# Patient Record
Sex: Male | Born: 1945 | Race: White | Hispanic: No | Marital: Married | Smoking: Never smoker
Health system: Southern US, Community
[De-identification: ages and names within clinical notes are randomized; demographics above are authoritative.]

## PROBLEM LIST (undated history)

## (undated) DIAGNOSIS — G8929 Other chronic pain: Secondary | ICD-10-CM

## (undated) DIAGNOSIS — M5412 Radiculopathy, cervical region: Secondary | ICD-10-CM

## (undated) DIAGNOSIS — K219 Gastro-esophageal reflux disease without esophagitis: Secondary | ICD-10-CM

## (undated) DIAGNOSIS — M5416 Radiculopathy, lumbar region: Secondary | ICD-10-CM

## (undated) DIAGNOSIS — I10 Essential (primary) hypertension: Secondary | ICD-10-CM

## (undated) DIAGNOSIS — M5136 Other intervertebral disc degeneration, lumbar region: Secondary | ICD-10-CM

## (undated) DIAGNOSIS — H47233 Glaucomatous optic atrophy, bilateral: Secondary | ICD-10-CM

## (undated) DIAGNOSIS — M47816 Spondylosis without myelopathy or radiculopathy, lumbar region: Secondary | ICD-10-CM

## (undated) DIAGNOSIS — R3129 Other microscopic hematuria: Secondary | ICD-10-CM

## (undated) DIAGNOSIS — N141 Nephropathy induced by other drugs, medicaments and biological substances: Secondary | ICD-10-CM

## (undated) DIAGNOSIS — I350 Nonrheumatic aortic (valve) stenosis: Secondary | ICD-10-CM

## (undated) DIAGNOSIS — D649 Anemia, unspecified: Secondary | ICD-10-CM

## (undated) DIAGNOSIS — E785 Hyperlipidemia, unspecified: Secondary | ICD-10-CM

## (undated) DIAGNOSIS — R803 Bence Jones proteinuria: Secondary | ICD-10-CM

## (undated) DIAGNOSIS — D472 Monoclonal gammopathy: Secondary | ICD-10-CM

## (undated) DIAGNOSIS — K222 Esophageal obstruction: Secondary | ICD-10-CM

## (undated) DIAGNOSIS — M12812 Other specific arthropathies, not elsewhere classified, left shoulder: Secondary | ICD-10-CM

## (undated) DIAGNOSIS — M48061 Spinal stenosis, lumbar region without neurogenic claudication: Secondary | ICD-10-CM

## (undated) DIAGNOSIS — E8581 Light chain (AL) amyloidosis: Secondary | ICD-10-CM

## (undated) DIAGNOSIS — Z961 Presence of intraocular lens: Secondary | ICD-10-CM

## (undated) DIAGNOSIS — T39395A Adverse effect of other nonsteroidal anti-inflammatory drugs [NSAID], initial encounter: Secondary | ICD-10-CM

## (undated) DIAGNOSIS — Q238 Other congenital malformations of aortic and mitral valves: Secondary | ICD-10-CM

## (undated) DIAGNOSIS — I729 Aneurysm of unspecified site: Secondary | ICD-10-CM

## (undated) DIAGNOSIS — I2510 Atherosclerotic heart disease of native coronary artery without angina pectoris: Secondary | ICD-10-CM

## (undated) DIAGNOSIS — M109 Gout, unspecified: Secondary | ICD-10-CM

## (undated) DIAGNOSIS — L570 Actinic keratosis: Secondary | ICD-10-CM

## (undated) DIAGNOSIS — G5620 Lesion of ulnar nerve, unspecified upper limb: Secondary | ICD-10-CM

## (undated) HISTORY — PX: EYE SURGERY: SHX253

## (undated) HISTORY — PX: CARDIAC SURGERY: SHX584

## (undated) HISTORY — PX: CARPAL TUNNEL RELEASE: SHX101

---

## 2014-03-26 HISTORY — PX: AORTIC VALVE REPLACEMENT: SHX41

## 2019-04-24 ENCOUNTER — Ambulatory Visit: Payer: Self-pay

## 2019-05-11 ENCOUNTER — Ambulatory Visit: Payer: Self-pay

## 2020-03-04 ENCOUNTER — Emergency Department (HOSPITAL_COMMUNITY): Payer: Medicare PPO

## 2020-03-04 ENCOUNTER — Other Ambulatory Visit: Payer: Self-pay

## 2020-03-04 ENCOUNTER — Observation Stay (HOSPITAL_COMMUNITY)
Admission: EM | Admit: 2020-03-04 | Discharge: 2020-03-06 | Disposition: A | Discharge status: Home / Self Care | Payer: Medicare PPO | Attending: Internal Medicine | Admitting: Internal Medicine

## 2020-03-04 DIAGNOSIS — Z79899 Other long term (current) drug therapy: Secondary | ICD-10-CM | POA: Diagnosis not present

## 2020-03-04 DIAGNOSIS — R55 Syncope and collapse: Principal | ICD-10-CM | POA: Diagnosis present

## 2020-03-04 DIAGNOSIS — Z20822 Contact with and (suspected) exposure to covid-19: Secondary | ICD-10-CM | POA: Insufficient documentation

## 2020-03-04 DIAGNOSIS — R531 Weakness: Secondary | ICD-10-CM

## 2020-03-04 DIAGNOSIS — Z7982 Long term (current) use of aspirin: Secondary | ICD-10-CM | POA: Insufficient documentation

## 2020-03-04 DIAGNOSIS — N1832 Chronic kidney disease, stage 3b: Secondary | ICD-10-CM | POA: Diagnosis not present

## 2020-03-04 DIAGNOSIS — D539 Nutritional anemia, unspecified: Secondary | ICD-10-CM | POA: Insufficient documentation

## 2020-03-04 DIAGNOSIS — Z9221 Personal history of antineoplastic chemotherapy: Secondary | ICD-10-CM | POA: Insufficient documentation

## 2020-03-04 DIAGNOSIS — R4020 Unspecified coma: Secondary | ICD-10-CM | POA: Diagnosis present

## 2020-03-04 DIAGNOSIS — R0602 Shortness of breath: Secondary | ICD-10-CM | POA: Insufficient documentation

## 2020-03-04 DIAGNOSIS — I129 Hypertensive chronic kidney disease with stage 1 through stage 4 chronic kidney disease, or unspecified chronic kidney disease: Secondary | ICD-10-CM | POA: Diagnosis not present

## 2020-03-04 DIAGNOSIS — G459 Transient cerebral ischemic attack, unspecified: Secondary | ICD-10-CM | POA: Diagnosis not present

## 2020-03-04 DIAGNOSIS — E8581 Light chain (AL) amyloidosis: Secondary | ICD-10-CM | POA: Diagnosis not present

## 2020-03-04 HISTORY — DX: Aneurysm of unspecified site: I72.9

## 2020-03-04 HISTORY — DX: Hyperlipidemia, unspecified: E78.5

## 2020-03-04 HISTORY — DX: Actinic keratosis: L57.0

## 2020-03-04 HISTORY — DX: Gastro-esophageal reflux disease without esophagitis: K21.9

## 2020-03-04 HISTORY — DX: Glaucomatous optic atrophy, bilateral: H47.233

## 2020-03-04 HISTORY — DX: Other congenital malformations of aortic and mitral valves: Q23.8

## 2020-03-04 HISTORY — DX: Adverse effect of other nonsteroidal anti-inflammatory drugs (NSAID), initial encounter: T39.395A

## 2020-03-04 HISTORY — DX: Lesion of ulnar nerve, unspecified upper limb: G56.20

## 2020-03-04 HISTORY — DX: Atherosclerotic heart disease of native coronary artery without angina pectoris: I25.10

## 2020-03-04 HISTORY — DX: Nephropathy induced by other drugs, medicaments and biological substances: N14.1

## 2020-03-04 HISTORY — DX: Presence of intraocular lens: Z96.1

## 2020-03-04 HISTORY — DX: Monoclonal gammopathy: D47.2

## 2020-03-04 HISTORY — DX: Gout, unspecified: M10.9

## 2020-03-04 HISTORY — DX: Spondylosis without myelopathy or radiculopathy, lumbar region: M47.816

## 2020-03-04 HISTORY — DX: Nonrheumatic aortic (valve) stenosis: I35.0

## 2020-03-04 HISTORY — DX: Radiculopathy, lumbar region: M54.16

## 2020-03-04 HISTORY — DX: Essential (primary) hypertension: I10

## 2020-03-04 HISTORY — DX: Other intervertebral disc degeneration, lumbar region: M51.36

## 2020-03-04 HISTORY — DX: Radiculopathy, cervical region: M54.12

## 2020-03-04 HISTORY — DX: Light chain (AL) amyloidosis: E85.81

## 2020-03-04 HISTORY — DX: Other chronic pain: G89.29

## 2020-03-04 HISTORY — DX: Other microscopic hematuria: R31.29

## 2020-03-04 HISTORY — DX: Other specific arthropathies, not elsewhere classified, left shoulder: M12.812

## 2020-03-04 HISTORY — DX: Anemia, unspecified: D64.9

## 2020-03-04 HISTORY — DX: Esophageal obstruction: K22.2

## 2020-03-04 HISTORY — DX: Spinal stenosis, lumbar region without neurogenic claudication: M48.061

## 2020-03-04 HISTORY — DX: Bence Jones proteinuria: R80.3

## 2020-03-04 LAB — DIFFERENTIAL
Abs Immature Granulocytes: 0.05 10*3/uL (ref 0.00–0.07)
Basophils Absolute: 0.1 10*3/uL (ref 0.0–0.1)
Basophils Relative: 1 %
Eosinophils Absolute: 0.2 10*3/uL (ref 0.0–0.5)
Eosinophils Relative: 1 %
Immature Granulocytes: 1 %
Lymphocytes Relative: 31 %
Lymphs Abs: 3.4 10*3/uL (ref 0.7–4.0)
Monocytes Absolute: 1 10*3/uL (ref 0.1–1.0)
Monocytes Relative: 9 %
Neutro Abs: 6.2 10*3/uL (ref 1.7–7.7)
Neutrophils Relative %: 57 %

## 2020-03-04 LAB — COMPREHENSIVE METABOLIC PANEL
ALT: 95 U/L — ABNORMAL HIGH (ref 0–44)
AST: 137 U/L — ABNORMAL HIGH (ref 15–41)
Albumin: 2.6 g/dL — ABNORMAL LOW (ref 3.5–5.0)
Alkaline Phosphatase: 312 U/L — ABNORMAL HIGH (ref 38–126)
Anion gap: 13 (ref 5–15)
BUN: 29 mg/dL — ABNORMAL HIGH (ref 8–23)
CO2: 21 mmol/L — ABNORMAL LOW (ref 22–32)
Calcium: 8.3 mg/dL — ABNORMAL LOW (ref 8.9–10.3)
Chloride: 104 mmol/L (ref 98–111)
Creatinine, Ser: 2.01 mg/dL — ABNORMAL HIGH (ref 0.61–1.24)
GFR, Estimated: 34 mL/min — ABNORMAL LOW (ref >60)
Glucose, Bld: 142 mg/dL — ABNORMAL HIGH (ref 70–99)
Potassium: 4 mmol/L (ref 3.5–5.1)
Sodium: 138 mmol/L (ref 135–145)
Total Bilirubin: 1 mg/dL (ref 0.3–1.2)
Total Protein: 5.2 g/dL — ABNORMAL LOW (ref 6.5–8.1)

## 2020-03-04 LAB — URINALYSIS, ROUTINE W REFLEX MICROSCOPIC
Bacteria, UA: NONE SEEN
Bilirubin Urine: NEGATIVE
Glucose, UA: NEGATIVE mg/dL
Hgb urine dipstick: NEGATIVE
Ketones, ur: NEGATIVE mg/dL
Leukocytes,Ua: NEGATIVE
Nitrite: NEGATIVE
Protein, ur: 100 mg/dL — AB
Specific Gravity, Urine: 1.011 (ref 1.005–1.030)
pH: 6 (ref 5.0–8.0)

## 2020-03-04 LAB — I-STAT CHEM 8, ED
BUN: 30 mg/dL — ABNORMAL HIGH (ref 8–23)
Calcium, Ion: 1.07 mmol/L — ABNORMAL LOW (ref 1.15–1.40)
Chloride: 105 mmol/L (ref 98–111)
Creatinine, Ser: 1.9 mg/dL — ABNORMAL HIGH (ref 0.61–1.24)
Glucose, Bld: 136 mg/dL — ABNORMAL HIGH (ref 70–99)
HCT: 25 % — ABNORMAL LOW (ref 39.0–52.0)
Hemoglobin: 8.5 g/dL — ABNORMAL LOW (ref 13.0–17.0)
Potassium: 3.9 mmol/L (ref 3.5–5.1)
Sodium: 137 mmol/L (ref 135–145)
TCO2: 20 mmol/L — ABNORMAL LOW (ref 22–32)

## 2020-03-04 LAB — CBC
HCT: 27.3 % — ABNORMAL LOW (ref 39.0–52.0)
Hemoglobin: 9 g/dL — ABNORMAL LOW (ref 13.0–17.0)
MCH: 33.3 pg (ref 26.0–34.0)
MCHC: 33 g/dL (ref 30.0–36.0)
MCV: 101.1 fL — ABNORMAL HIGH (ref 80.0–100.0)
Platelets: 176 10*3/uL (ref 150–400)
RBC: 2.7 MIL/uL — ABNORMAL LOW (ref 4.22–5.81)
RDW: 18.5 % — ABNORMAL HIGH (ref 11.5–15.5)
WBC: 10.8 10*3/uL — ABNORMAL HIGH (ref 4.0–10.5)
nRBC: 0.6 % — ABNORMAL HIGH (ref 0.0–0.2)

## 2020-03-04 LAB — APTT: aPTT: 28 seconds (ref 24–36)

## 2020-03-04 LAB — CBG MONITORING, ED: Glucose-Capillary: 128 mg/dL — ABNORMAL HIGH (ref 70–99)

## 2020-03-04 LAB — RESP PANEL BY RT-PCR (FLU A&B, COVID) ARPGX2
Influenza A by PCR: NEGATIVE
Influenza B by PCR: NEGATIVE
SARS Coronavirus 2 by RT PCR: NEGATIVE

## 2020-03-04 LAB — TROPONIN I (HIGH SENSITIVITY): Troponin I (High Sensitivity): 116 ng/L (ref <18)

## 2020-03-04 LAB — PROTIME-INR
INR: 1 (ref 0.8–1.2)
Prothrombin Time: 12.8 seconds (ref 11.4–15.2)

## 2020-03-04 LAB — BRAIN NATRIURETIC PEPTIDE: B Natriuretic Peptide: 440.7 pg/mL — ABNORMAL HIGH (ref 0.0–100.0)

## 2020-03-04 IMAGING — MR MR MRA HEAD W/O CM
1 series · 26 of 48 positions shown · non-contrast
Comparison: Brain MRI, neck MRA today.

CLINICAL DATA: 74-year-old male code stroke, left-sided weakness.
TIA.

EXAM:
MRA HEAD WITHOUT CONTRAST
TECHNIQUE: Angiographic images of the Circle of Willis were obtained using MRA
technique without intravenous contrast.

[Series 5: 3d cow · axial · 0.5mm · 0.41mm/px · z∈[-41,+38]mm · 26 of 172 slices shown]
[im 1/172]
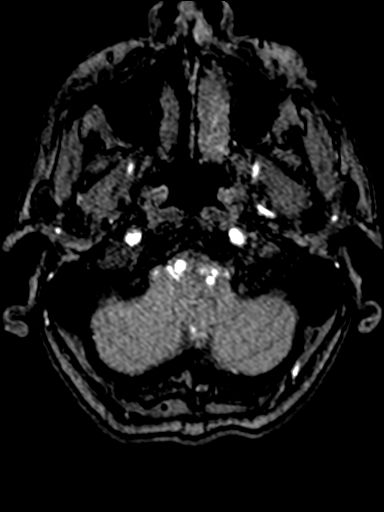
[im 4/172]
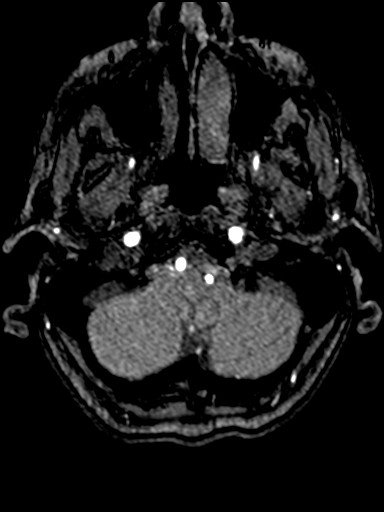
[im 8/172]
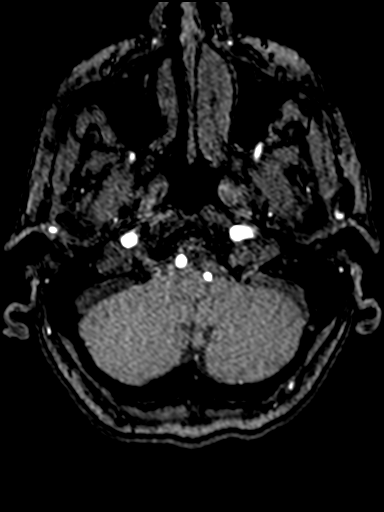
[im 11/172]
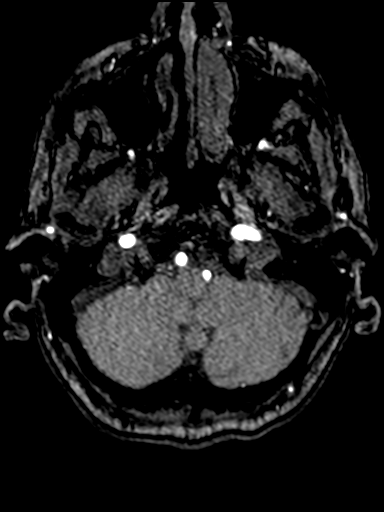
[im 15/172]
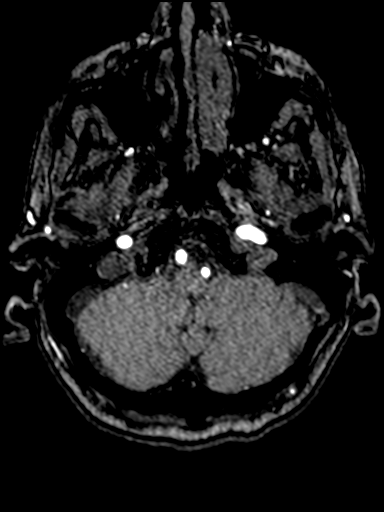
[im 19/172]
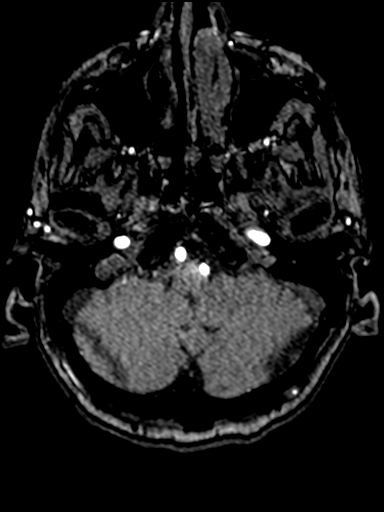
[im 22/172]
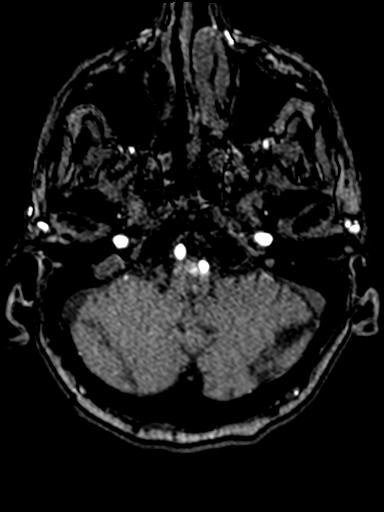
[im 26/172]
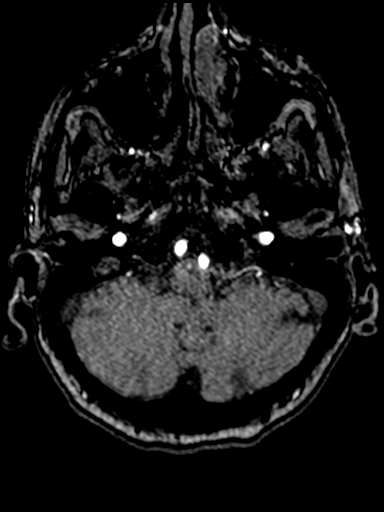
[im 30/172]
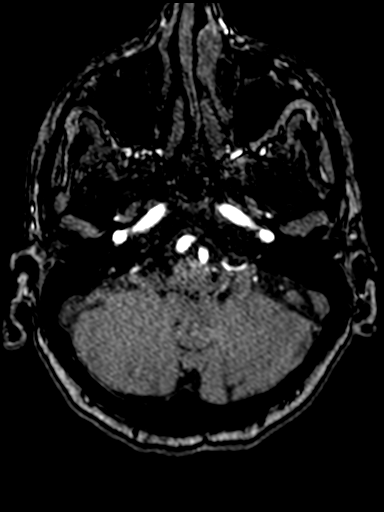
[im 33/172]
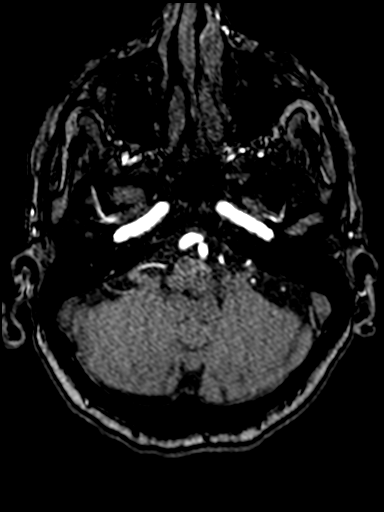
[im 37/172]
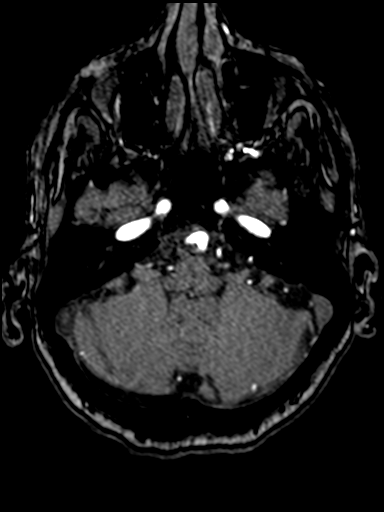
[im 41/172]
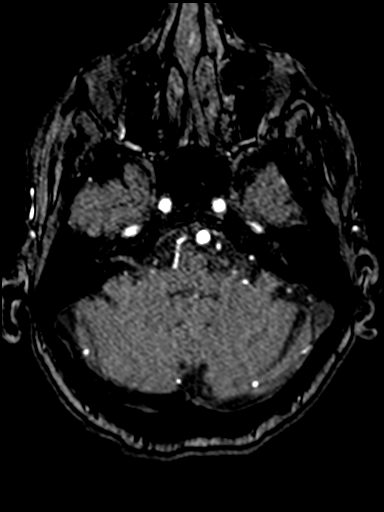
[im 44/172]
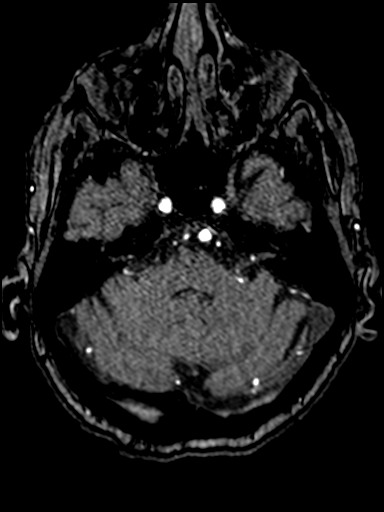
[im 48/172]
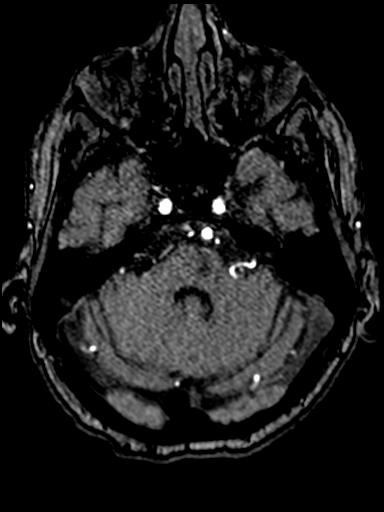
[im 51/172]
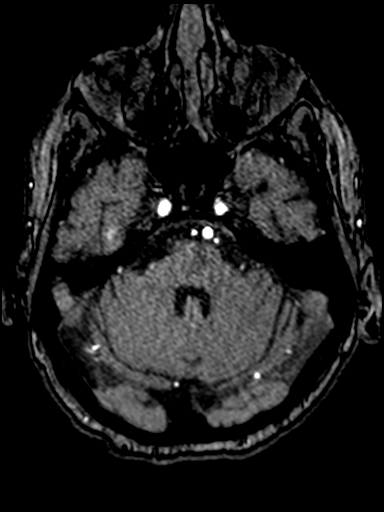
[im 55/172]
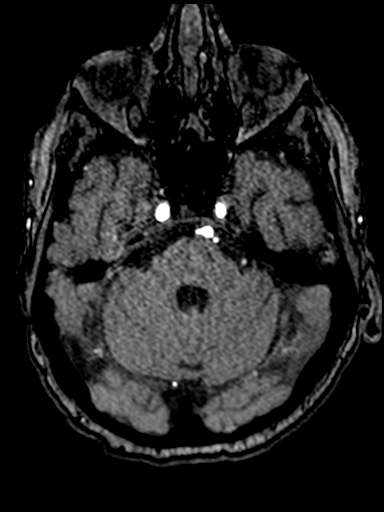
[im 59/172]
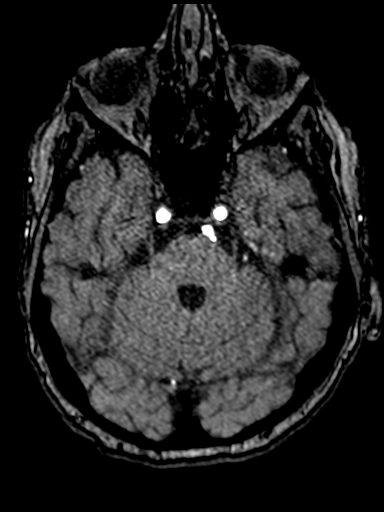
[im 62/172]
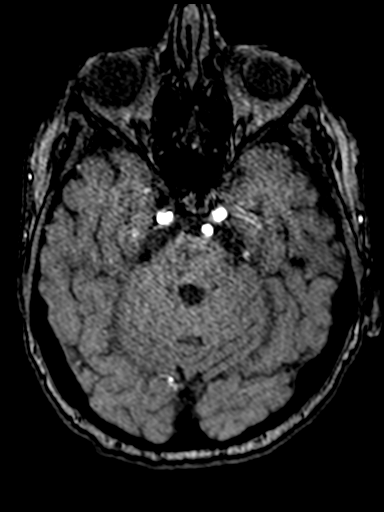
[im 66/172]
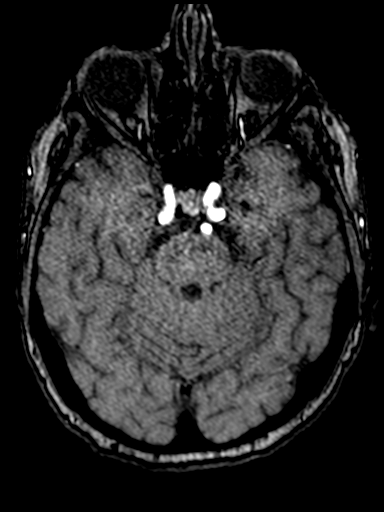
[im 77/172]
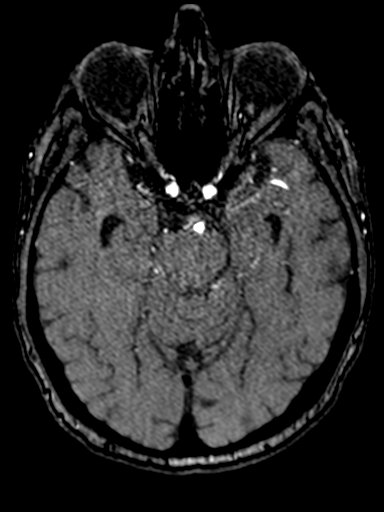
[im 88/172]
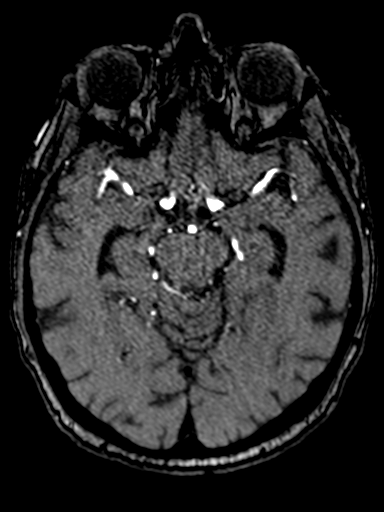
[im 99/172]
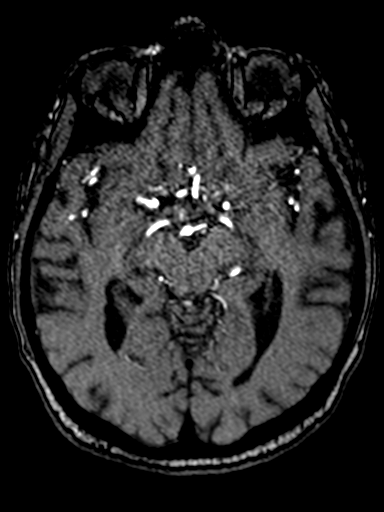
[im 121/172]
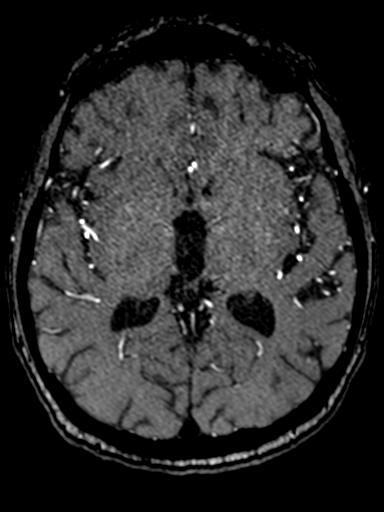
[im 142/172]
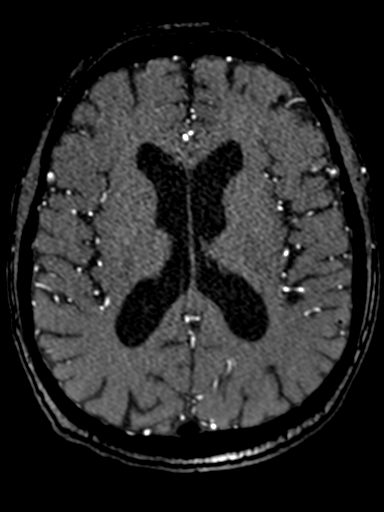
[im 146/172]
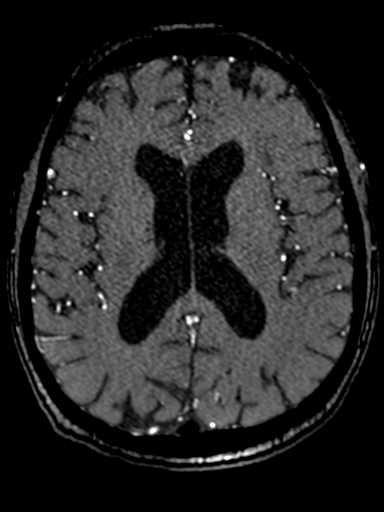
[im 164/172]
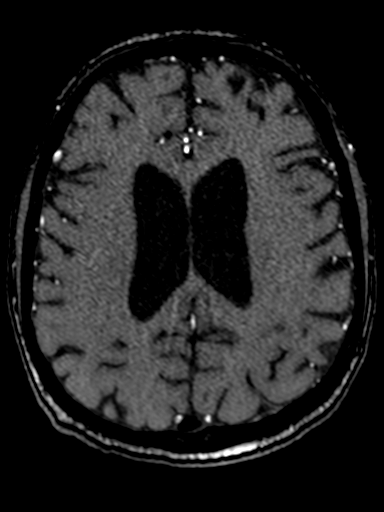

[26 of 48 positions shown; findings below may reference images not displayed]

FINDINGS: Antegrade flow in the posterior circulation with mildly dominant
distal right vertebral artery. No distal vertebral or
vertebrobasilar junction stenosis. Patent basilar artery with no
stenosis. Normal AICA, SCA and PCA origins. Posterior communicating
arteries are diminutive or absent. Bilateral PCA branches are within
normal limits.

Antegrade flow in both ICA siphons. Mild siphon tortuosity. No
siphon stenosis. Normal ophthalmic artery origins. Patent carotid
termini. MCA and ACA origins appear normal. Diminutive or absent
anterior communicating artery. Visible bilateral ACA branches are
within normal limits. MCA M1 segments, left MCA bifurcation, and
right MCA trifurcation are patent without stenosis. Visible
bilateral MCA branches are within normal limits.
IMPRESSION: Negative intracranial MRA.

## 2020-03-04 IMAGING — MR MR MRA NECK W/O CM
2 series · 41 of 48 positions shown · non-contrast
Comparison: Brain MRI today reported separately.

CLINICAL DATA: 74-year-old male code stroke, left-sided weakness.
TIA.

EXAM:
MRA NECK WITHOUT CONTRAST
TECHNIQUE: Angiographic images of the neck were obtained using MRA technique
without intravenous contrast. Carotid stenosis measurements (when
applicable) are obtained utilizing NASCET criteria, using the distal
internal carotid diameter as the denominator.

[Series 10: tof_fl3d_tra_iso · axial · 0.6mm · 0.52mm/px · z∈[-237,-35]mm · 40 of 376 slices shown]
[im 1/376]
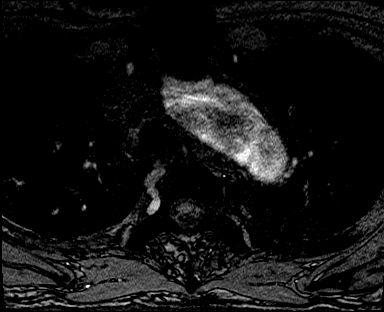
[im 9/376]
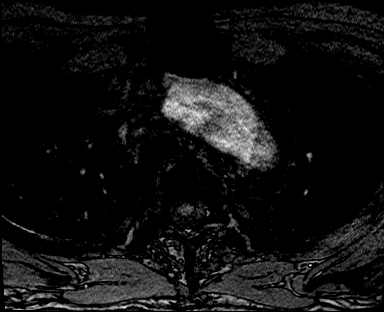
[im 17/376]
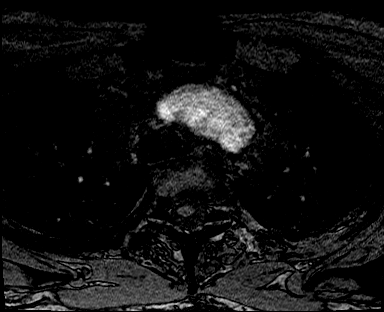
[im 25/376]
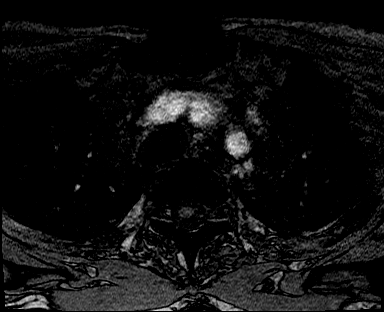
[im 33/376]
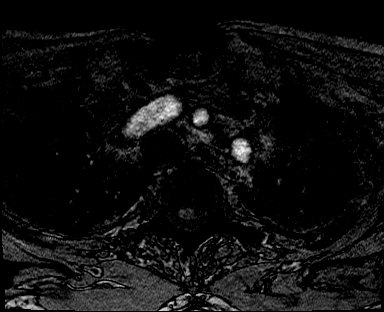
[im 41/376]
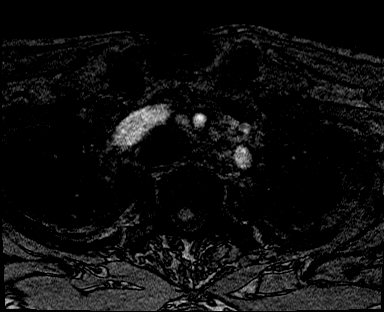
[im 49/376]
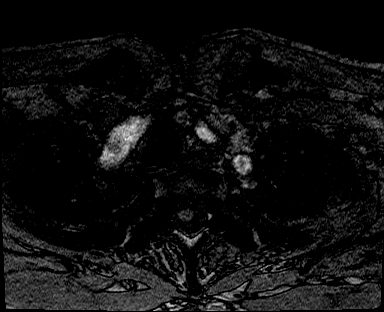
[im 58/376]
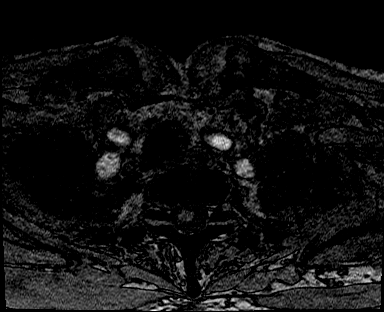
[im 66/376]
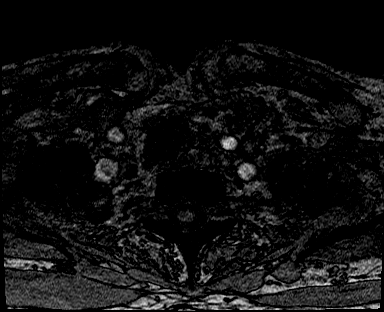
[im 74/376]
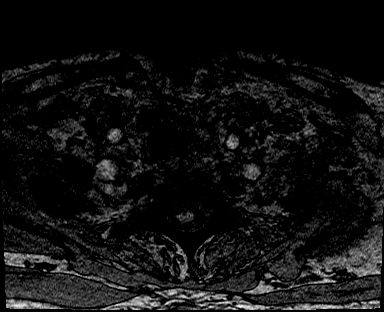
[im 82/376]
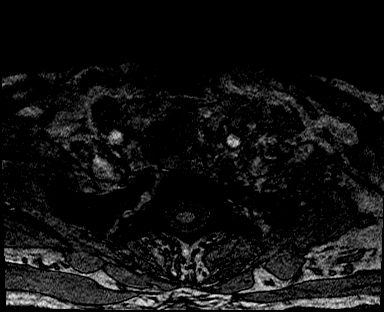
[im 90/376]
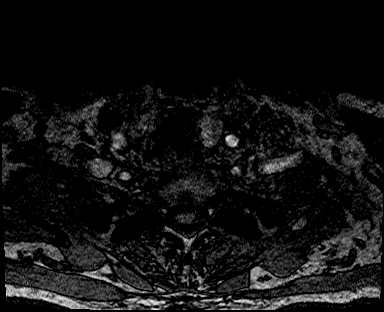
[im 98/376]
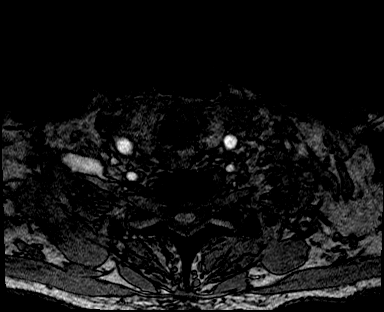
[im 106/376]
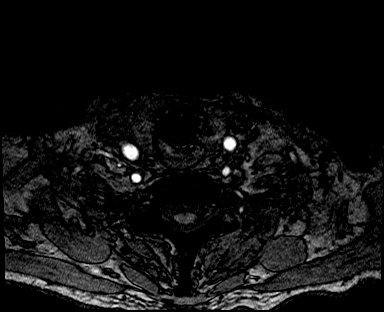
[im 115/376]
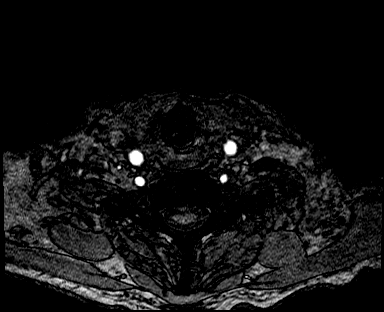
[im 123/376]
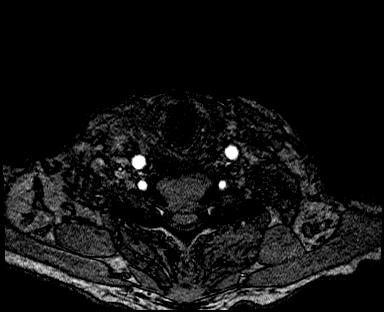
[im 131/376]
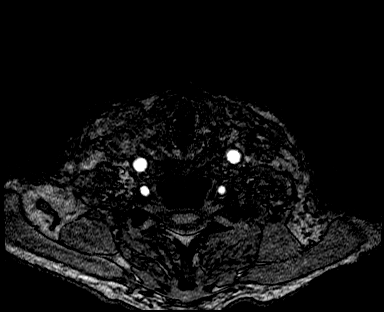
[im 139/376]
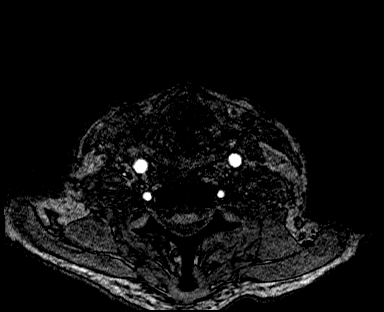
[im 147/376]
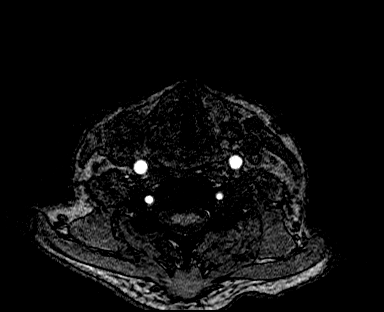
[im 155/376]
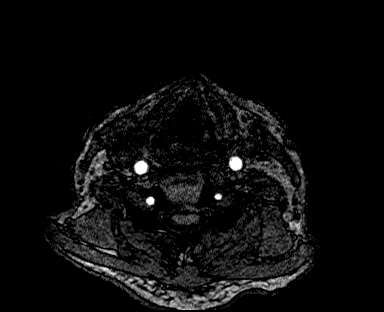
[im 164/376]
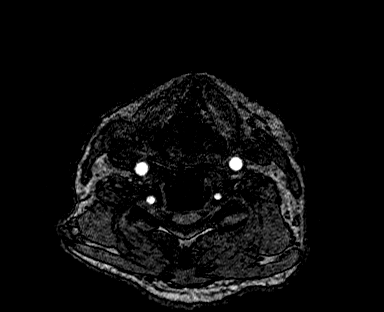
[im 172/376]
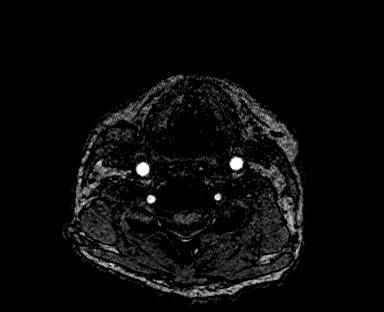
[im 180/376]
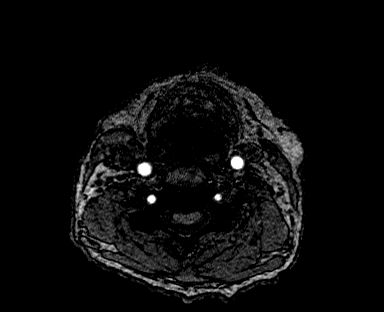
[im 188/376]
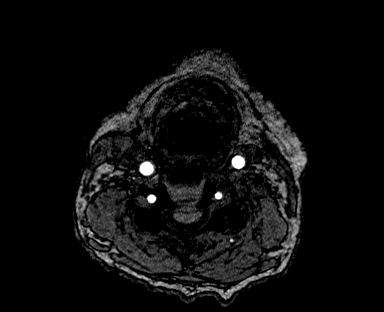
[im 196/376]
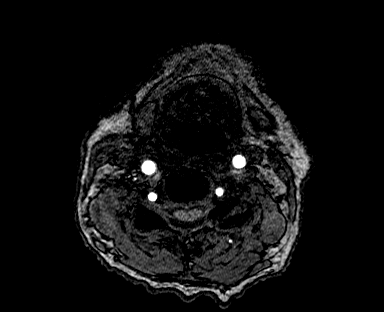
[im 204/376]
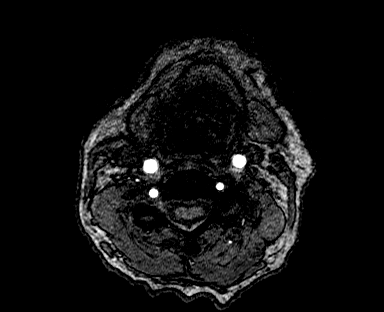
[im 212/376]
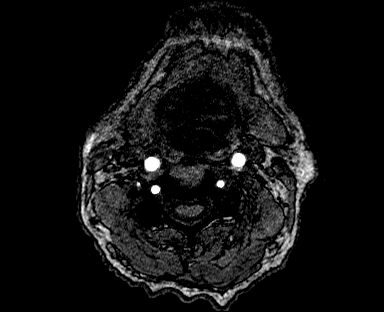
[im 221/376]
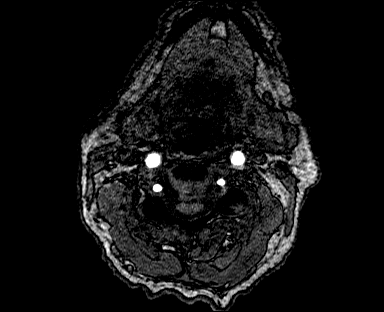
[im 229/376]
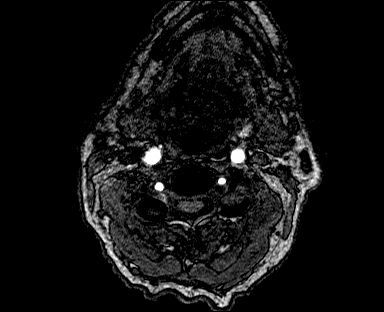
[im 237/376]
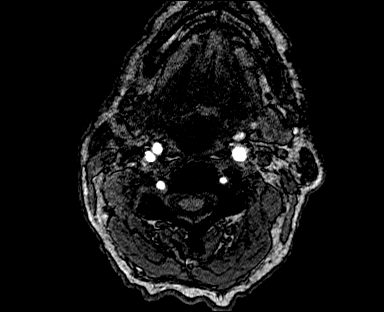
[im 245/376]
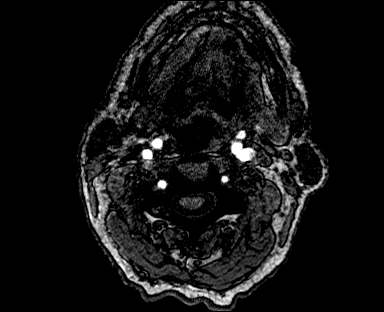
[im 253/376]
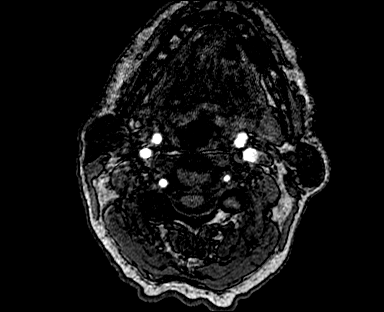
[im 261/376]
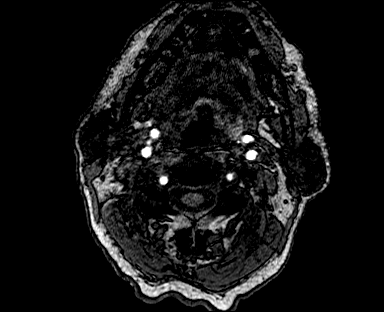
[im 270/376]
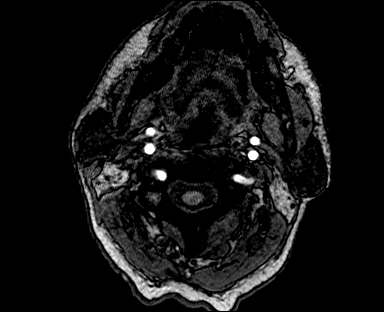
[im 278/376]
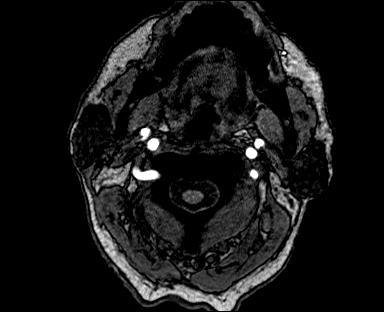
[im 286/376]
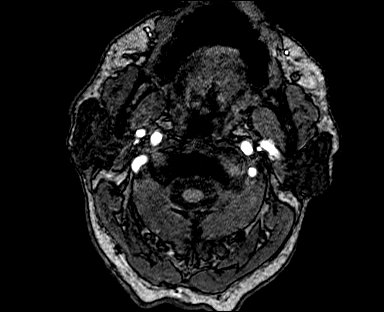
[im 294/376]
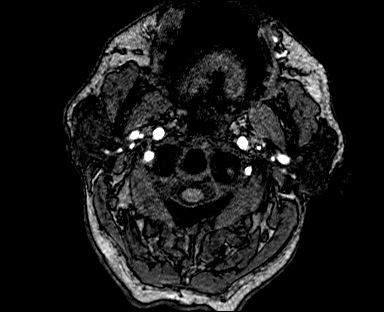
[im 310/376]
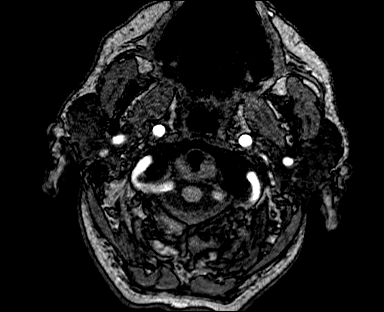
[im 318/376]
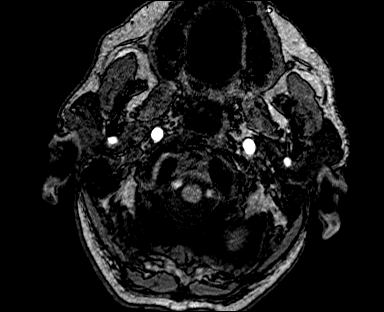
[im 359/376]
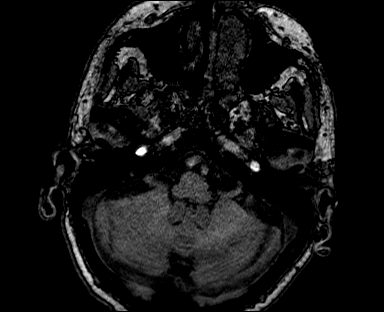

[Series 14: t1_se_fs_tra_blood-suppr. · axial · 2.0mm · 0.47mm/px · 1 of 11 slices shown]
[im 1/11]
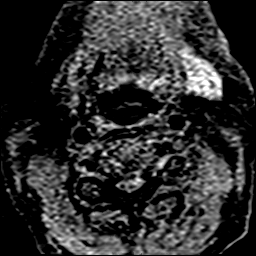

[41 of 48 positions shown; findings below may reference images not displayed]

FINDINGS: Time-of-flight MRA images reveal antegrade flow in the bilateral
cervical carotid and vertebral arteries.

The level of the aortic arch is included and a 3 vessel arch
configuration is noted.

Right CCA, right carotid bifurcation, and cervical right ICA are
within normal limits.

Left CCA, left carotid bifurcation and cervical left ICA are normal
aside from tortuosity.

Grossly normal proximal subclavian arteries and vertebral artery
origins. No evidence of cervical vertebral stenosis, the right
vertebral appears slightly dominant.
IMPRESSION: Normal for age noncontrast neck MRA.

## 2020-03-04 IMAGING — CT CT HEAD CODE STROKE
3 series · 15 of 47 positions shown, 18 images · non-contrast
Comparison: None.

CLINICAL DATA: Code stroke.  Left-sided weakness.

EXAM:
CT HEAD WITHOUT CONTRAST
TECHNIQUE: Contiguous axial images were obtained from the base of the skull
through the vertex without intravenous contrast.

[Series 3: head 5.0 st · axial · 0.41mm/px · z∈[-109,+31]mm · 9 of 34 slices shown, 12 images]
[im 3/34  brain]
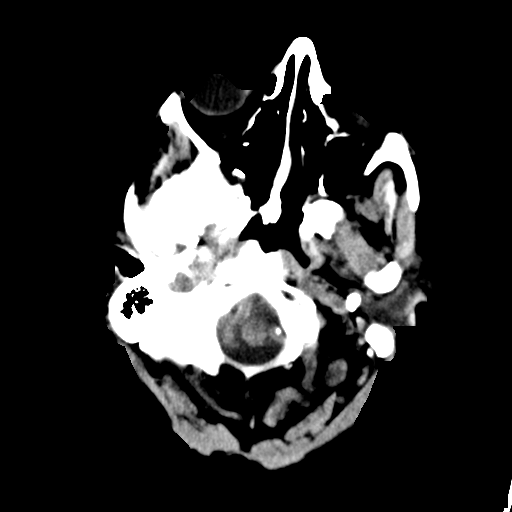
[im 3/34  bone]
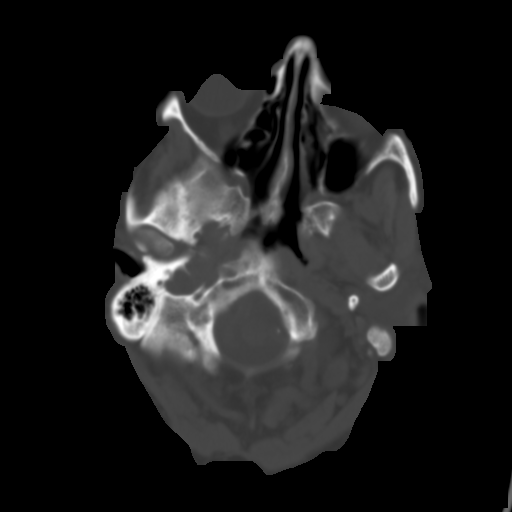
[im 6/34  brain]
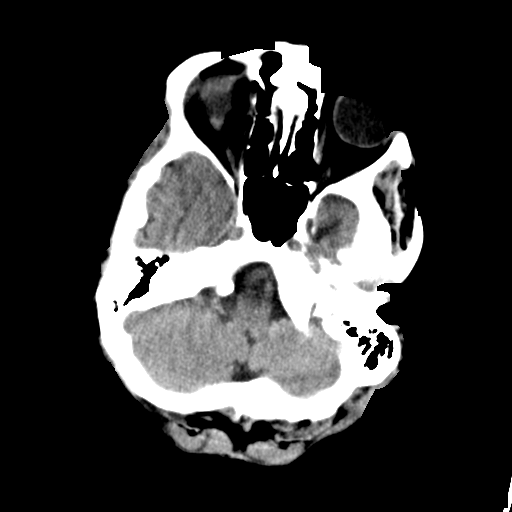
[im 10/34  brain]
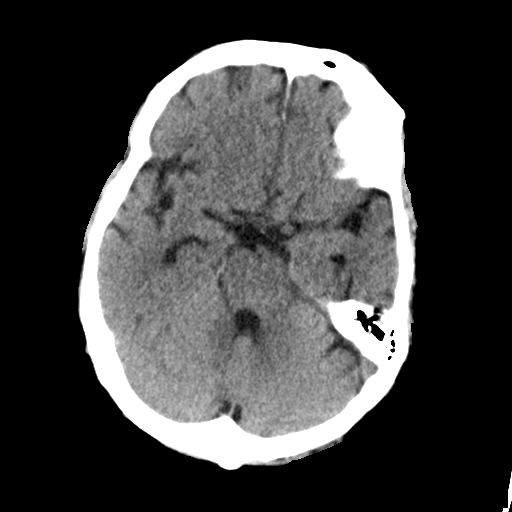
[im 13/34  brain]
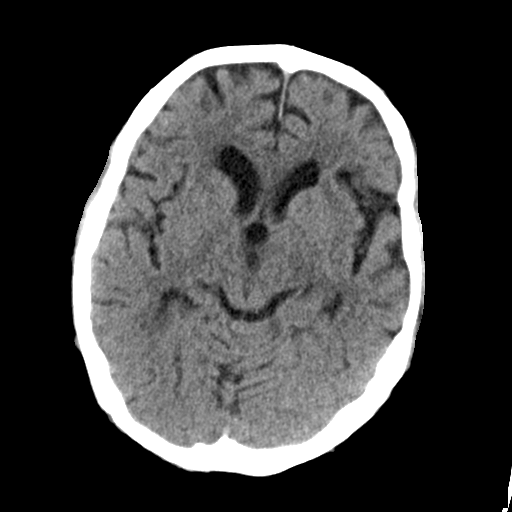
[im 18/34  brain]
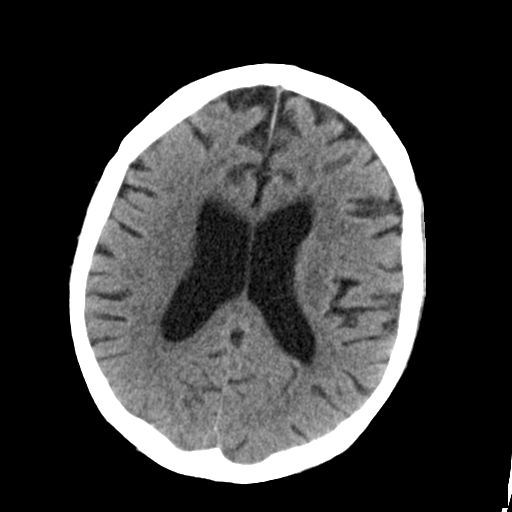
[im 18/34  bone]
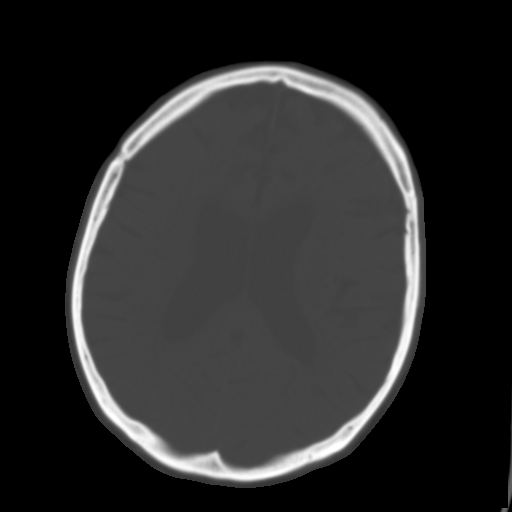
[im 21/34  brain]
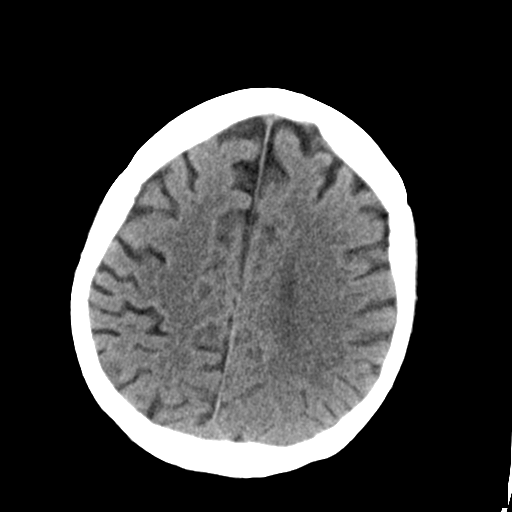
[im 24/34  brain]
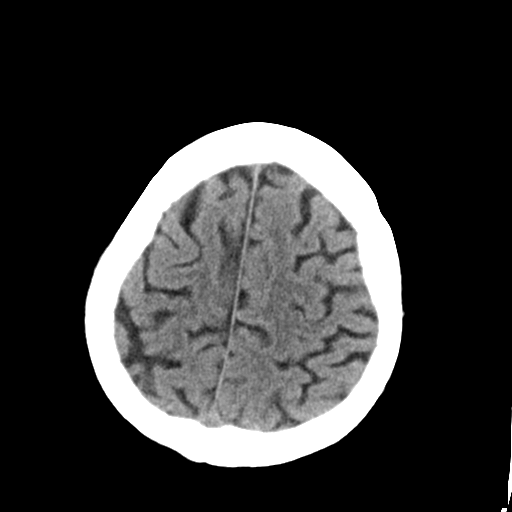
[im 28/34  brain]
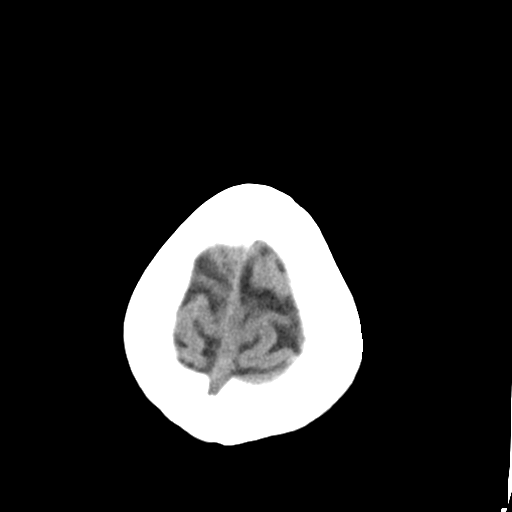
[im 31/34  brain]
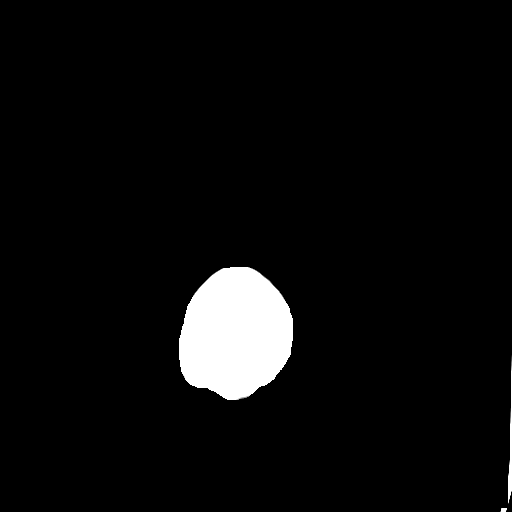
[im 31/34  bone]
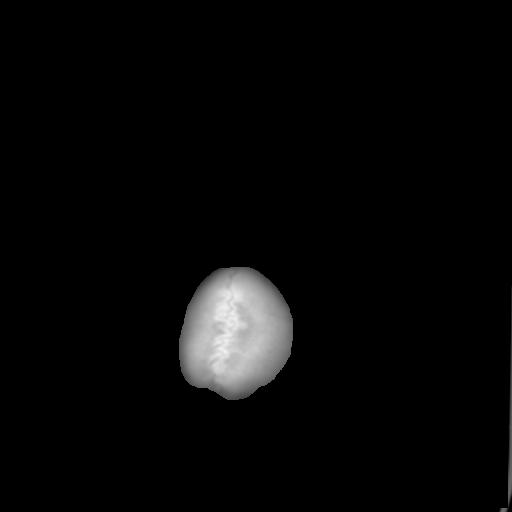

[Series 5: head 3.0 cor st · coronal · 0.30mm/px · 3 of 67 slices shown]
[im 23/67  brain]
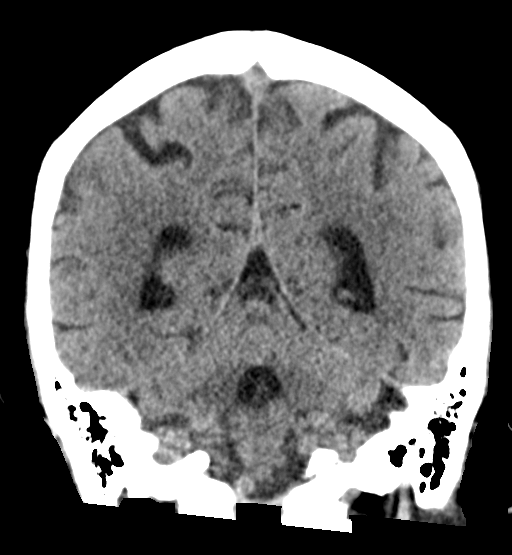
[im 30/67  brain]
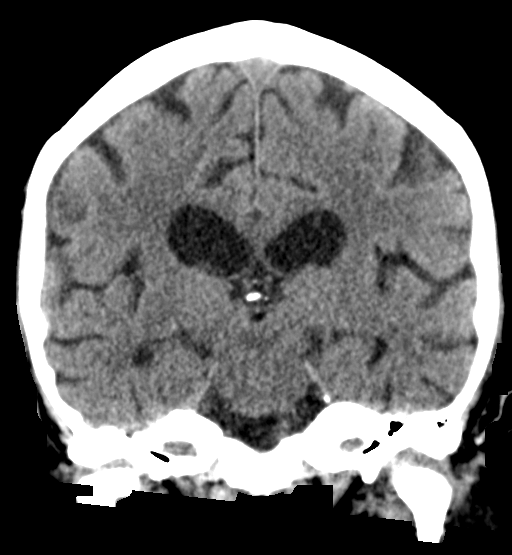
[im 37/67  brain]
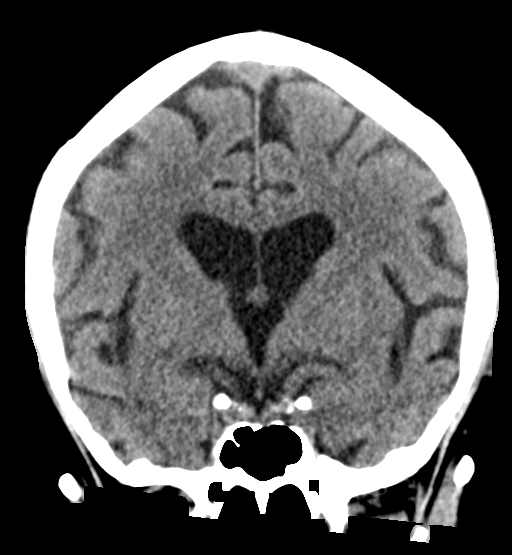

[Series 6: head 3.0 sag st · sagittal · 0.33mm/px · 3 of 52 slices shown]
[im 18/52  brain]
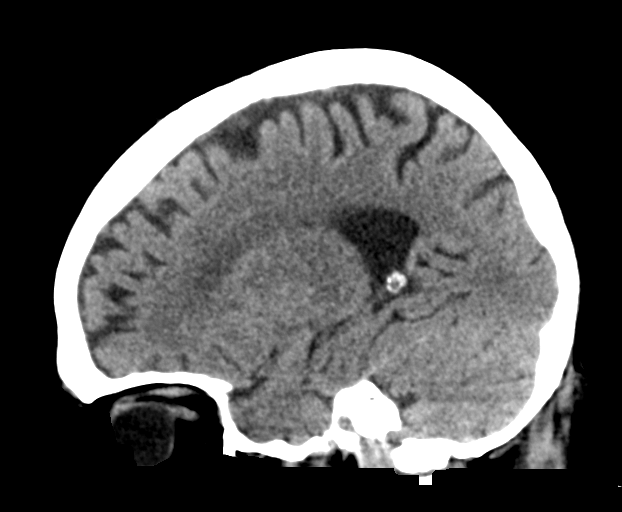
[im 26/52  brain]
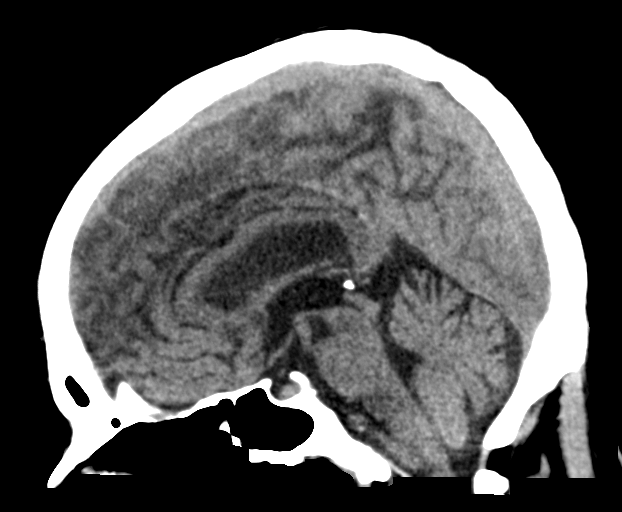
[im 35/52  brain]
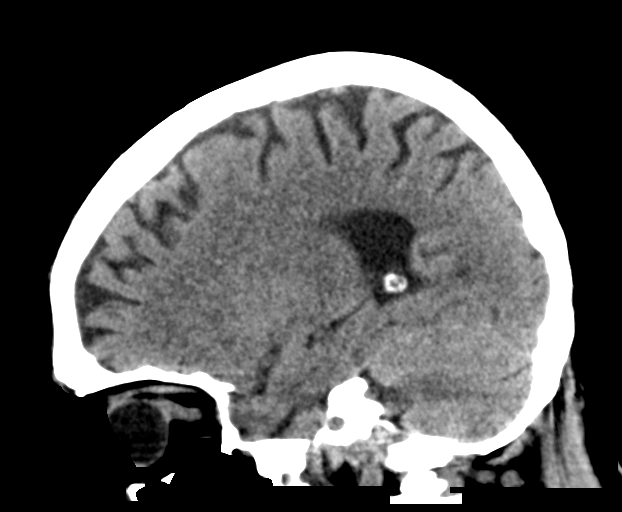

[15 of 47 positions shown; findings below may reference images not displayed]

FINDINGS: Brain: Mild atrophy and white matter hypoattenuation present. Basal
ganglia are intact. Insular ribbon is normal bilaterally. No acute
or cortical abnormality is present. The ventricles are proportionate
to the degree of atrophy. Is The brainstem and cerebellum are within
normal limits.

Vascular: Atherosclerotic calcifications are present within the
cavernous internal carotid arteries bilaterally. No hyperdense
vessel is present.

Skull: Insert normal skull No significant extracranial soft tissue
lesion is present.

Sinuses/Orbits: The paranasal sinuses and mastoid air cells are
clear. Bilateral lens replacements are noted. Globes and orbits are
otherwise unremarkable.

ASPECTS ([place] Stroke Program Early CT Score)

- Ganglionic level infarction (caudate, lentiform nuclei, internal
capsule, insula, M1-M3 cortex): [DATE]

- Supraganglionic infarction (M4-M6 cortex): [DATE]

Total score (0-10 with 10 being normal): [DATE]
IMPRESSION: 1. No acute intracranial abnormality.
2. Mild atrophy and white matter disease likely reflects the sequela
of chronic microvascular ischemia.
3. Aspects [DATE]

The above was relayed via text pager to Dr. HANH on

## 2020-03-04 IMAGING — MR MR HEAD W/O CM
10 of 11 series · 43 of 48 positions shown · non-contrast
Comparison: Head CT without contrast [WH] hours today.

CLINICAL DATA: 74-year-old male code stroke, left-sided weakness.
TIA.

EXAM:
MRI HEAD WITHOUT CONTRAST
TECHNIQUE: Multiplanar, multiecho pulse sequences of the brain and surrounding
structures were obtained without intravenous contrast.

[Series 5: DWI · axial · 3.0mm · 0.88mm/px · z∈[-59,+90]mm · 10 of 104 slices shown (1 of 4)]
[im 1/104]
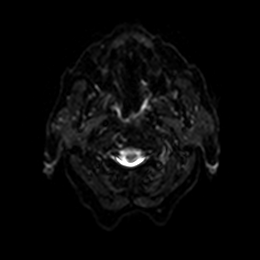
[im 12/104]
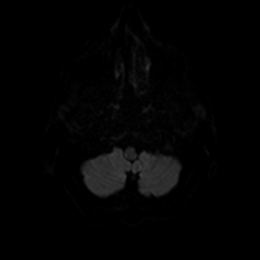
[im 23/104]
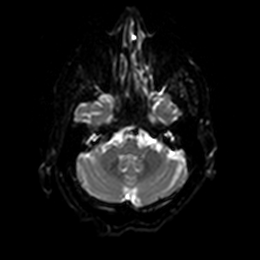
[im 35/104]
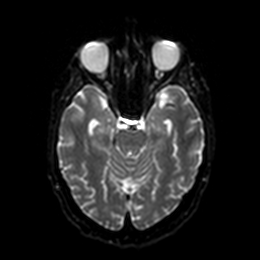
[im 46/104]
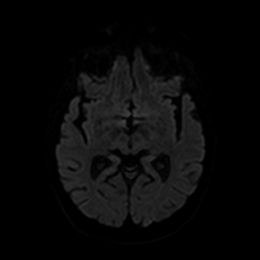
[im 58/104]
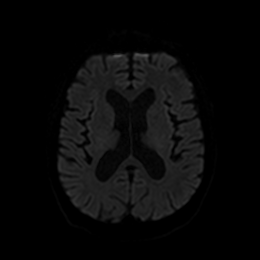
[im 69/104]
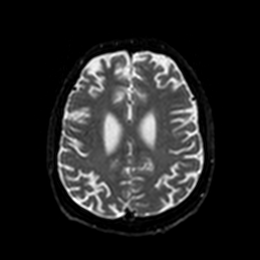
[im 81/104]
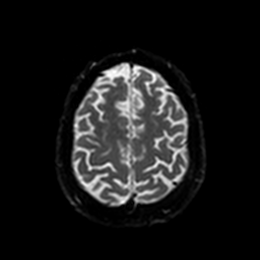
[im 92/104]
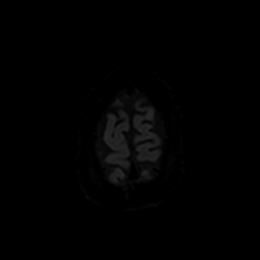
[im 104/104]
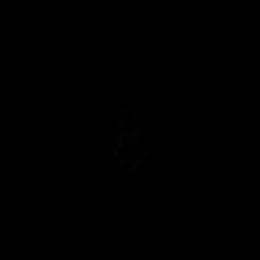

[Series 6: DWI · axial · 3.0mm · 0.88mm/px · z∈[-59,+90]mm · 5 of 52 slices shown (2 of 4)]
[im 1/52]
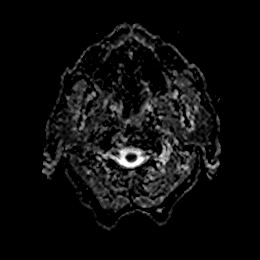
[im 13/52]
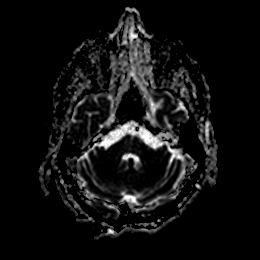
[im 26/52]
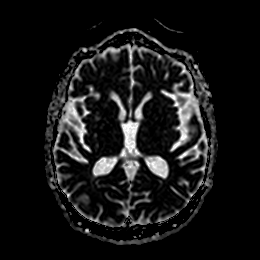
[im 39/52]
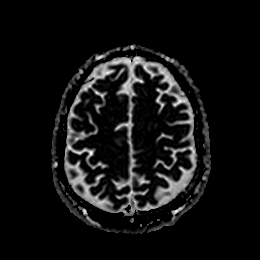
[im 52/52]
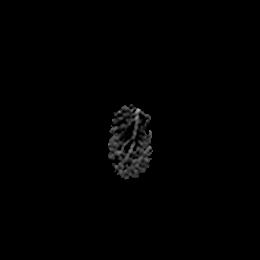

[Series 7: DWI · coronal · 4.0mm · 0.88mm/px · 6 of 70 slices shown (3 of 4)]
[im 1/70]
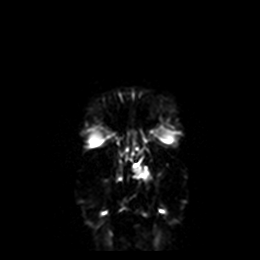
[im 14/70]
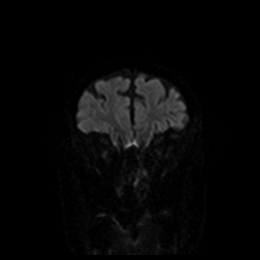
[im 28/70]
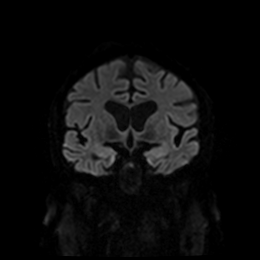
[im 42/70]
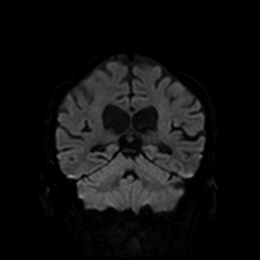
[im 56/70]
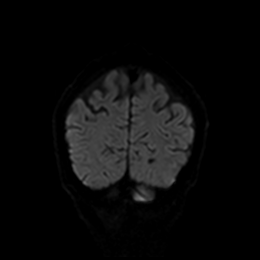
[im 70/70]
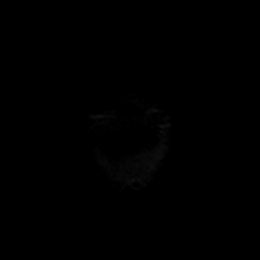

[Series 8: DWI · coronal · 4.0mm · 0.88mm/px · 3 of 35 slices shown (4 of 4)]
[im 1/35]
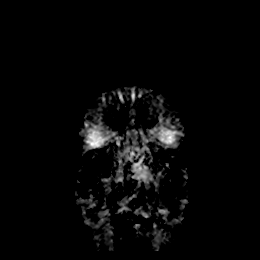
[im 18/35]
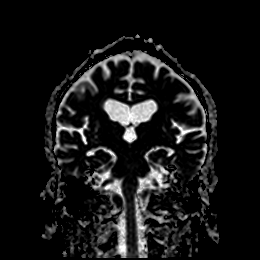
[im 35/35]
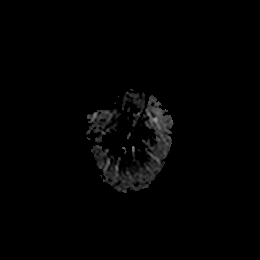

[Series 9: FLAIR · axial · 5.0mm · 0.45mm/px · z∈[-54,+87]mm · 2 of 25 slices shown]
[im 1/25]
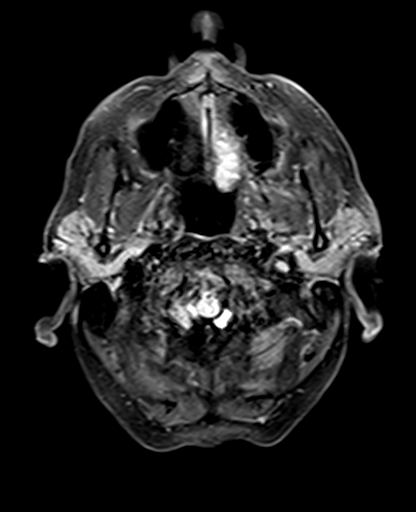
[im 25/25]
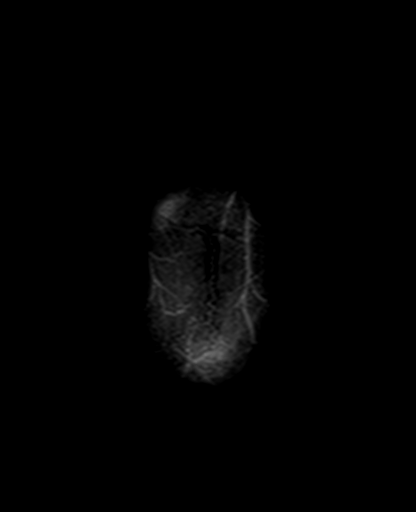

[Series 11: pha_images · axial · 3.0mm · 0.90mm/px · z∈[-64,+88]mm · 5 of 53 slices shown]
[im 1/53]
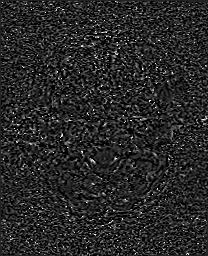
[im 14/53]
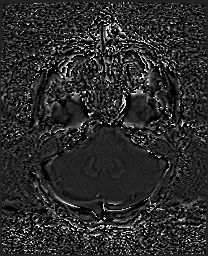
[im 27/53]
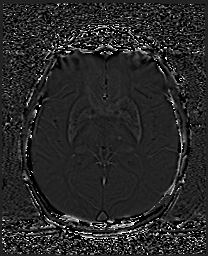
[im 40/53]
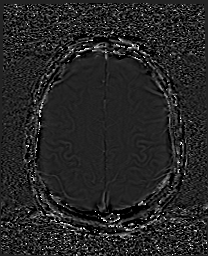
[im 53/53]
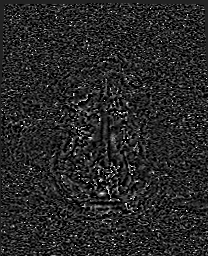

[Series 12: swi_images · axial · 3.0mm · 0.90mm/px · z∈[-64,+97]mm · 5 of 56 slices shown]
[im 1/56]
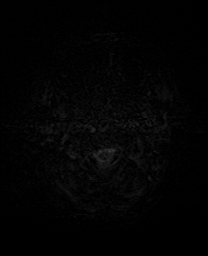
[im 14/56]
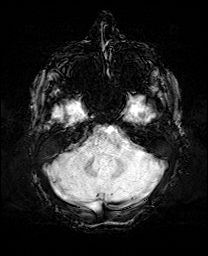
[im 28/56]
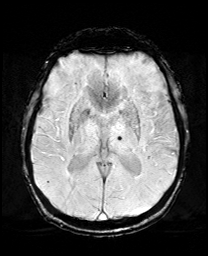
[im 42/56]
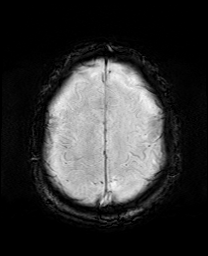
[im 56/56]
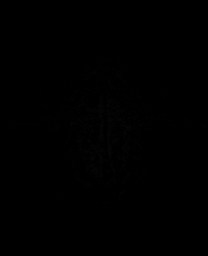

[Series 14: T1 · sagittal · 5.0mm · 0.75mm/px · 2 of 25 slices shown]
[im 1/25]
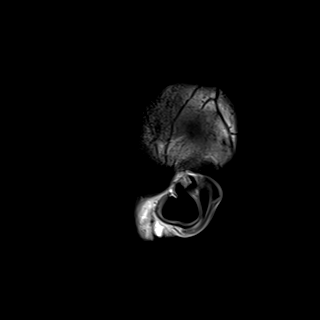
[im 25/25]
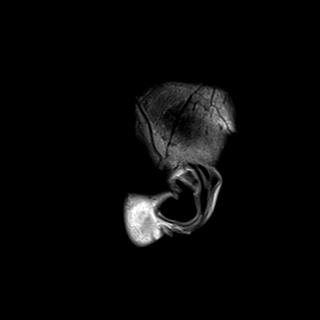

[Series 15: T2 · axial · 5.0mm · 0.72mm/px · z∈[-55,+86]mm · 2 of 25 slices shown (1 of 2)]
[im 1/25]
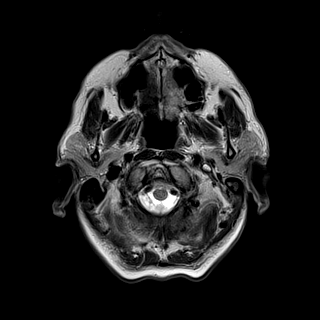
[im 25/25]
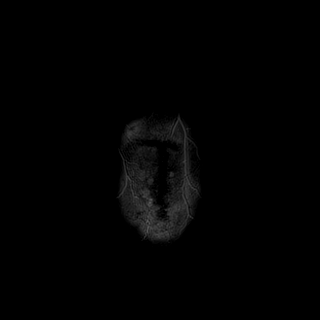

[Series 17: T2 · coronal · 5.0mm · 0.34mm/px · 3 of 29 slices shown (2 of 2)]
[im 1/29]
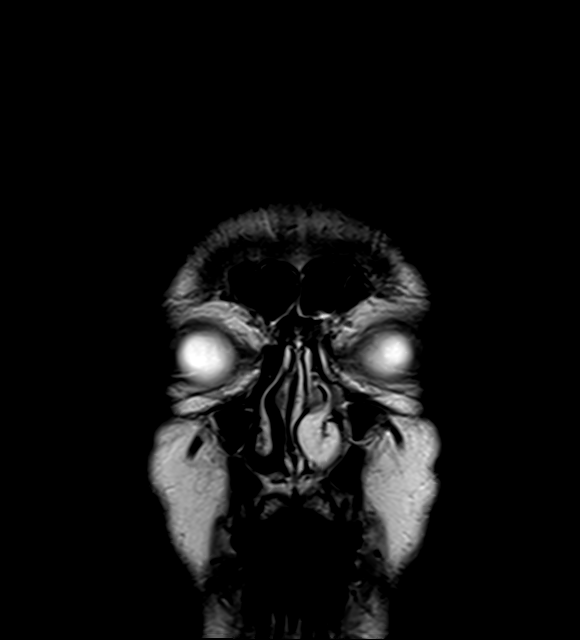
[im 15/29]
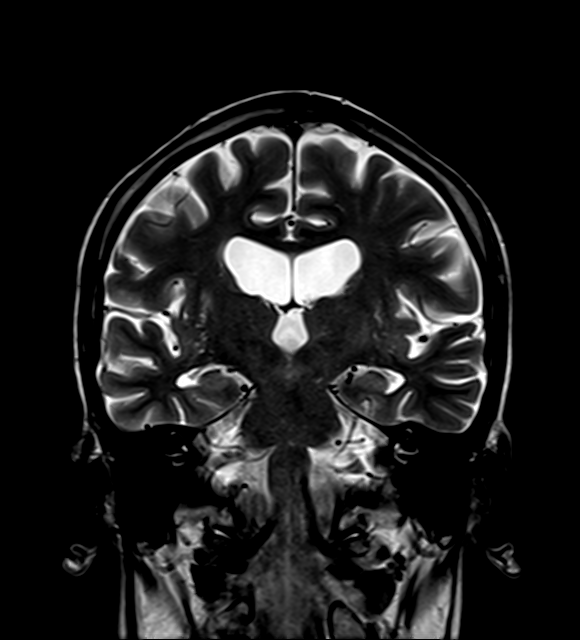
[im 29/29]
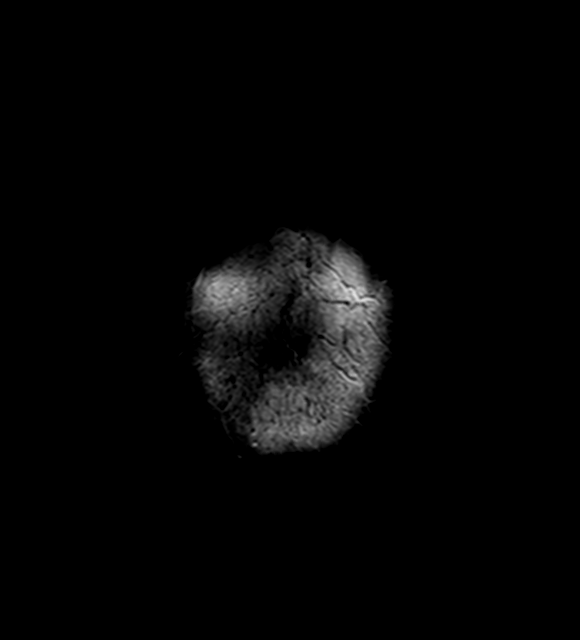

[43 of 48 positions shown; findings below may reference images not displayed]

FINDINGS: Brain: No restricted diffusion to suggest acute infarction. No
midline shift, mass effect, evidence of mass lesion,
ventriculomegaly, extra-axial collection or acute intracranial
hemorrhage. Cervicomedullary junction and pituitary are within
normal limits.

Suggestion of disproportionate midbrain volume loss on the sagittal
image (series 14, image 13). No cortical encephalomalacia
identified, but there are multiple scattered chronic
microhemorrhages in the brain, most notably the left thalamus
(series 12, image 28). The modest extent does not suggest amyloid
angiopathy. Superimposed mild for age nonspecific cerebral white
matter signal changes, such as at the posterior external capsule
and/or lentiform seen on series 9, image 14.

Vascular: Major intracranial vascular flow voids are preserved.
Generalized intracranial artery tortuosity.

Skull and upper cervical spine: Negative for age visible cervical
spine; degenerative ligamentous hypertrophy about the odontoid.
Visualized bone marrow signal is within normal limits.

Sinuses/Orbits: Postoperative changes to both globes. Paranasal
sinuses and mastoids are stable and well pneumatized.

Other: Visible internal auditory structures appear normal. Visible
scalp and face appear negative.
IMPRESSION: 1. No acute intracranial abnormality.
2. Several chronic microhemorrhages scattered in the brain, most
notably at the left thalamus. Mild for age additional signal changes
likely due to chronic small vessel disease.
3. Suggestion of disproportionate midbrain volume loss.

## 2020-03-04 IMAGING — DX DG CHEST 1V PORT
1 series · 1 of 1 positions shown · non-contrast
Comparison: CT chest report [DATE].

CLINICAL DATA: Shortness of breath.

EXAM:
PORTABLE CHEST 1 VIEW

[chest ap]
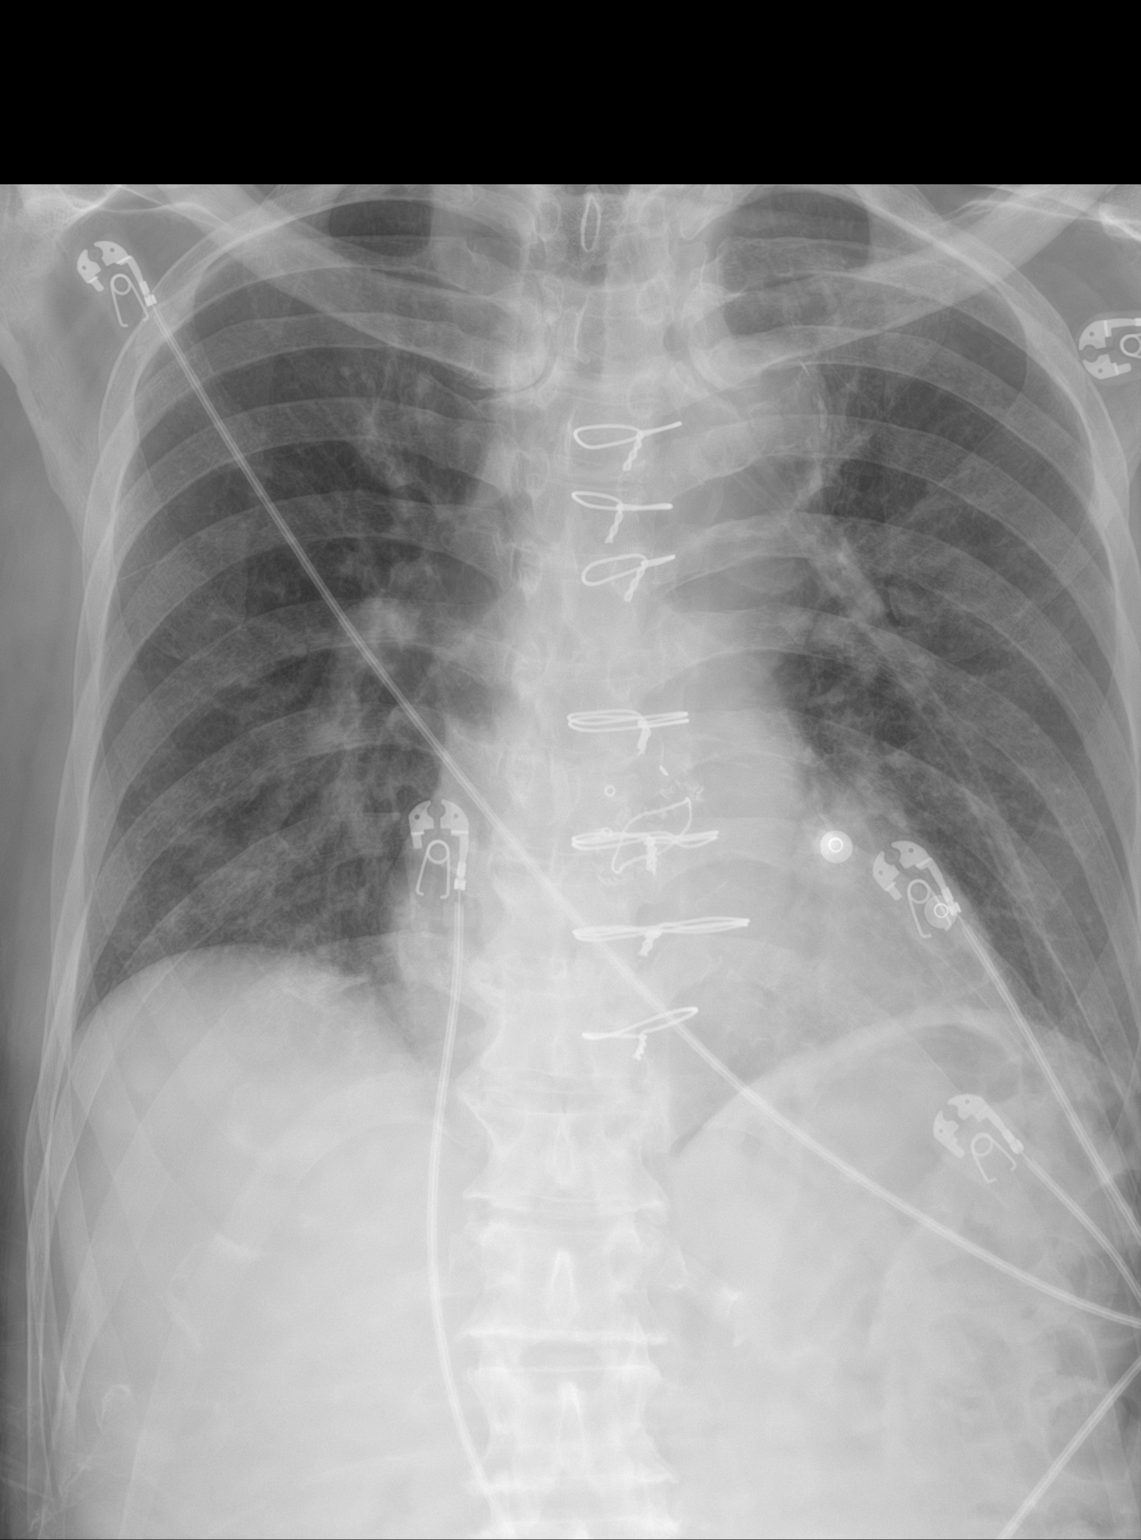

[1 of 1 positions shown; findings below may reference images not displayed]

FINDINGS: Prior median sternotomy and cardiac valve replacement. Thoracic
aortic prominence. Thoracic aortic aneurysm was noted by report on
prior CT of [DATE]. Repeat contrast-enhanced chest CT should be
considered for follow-up. Mild bilateral interstitial prominence.
This may be chronic. An active interstitial process cannot be
completely excluded. No pleural effusion or pneumothorax. Left
costophrenic angle incompletely imaged. Degenerative change thoracic
spine.
IMPRESSION: 1. Prior median sternotomy and cardiac valve replacement. Thoracic
aortic prominence. Thoracic aortic aneurysm was noted by report on
prior CT of [DATE]. Repeat contrast-enhanced chest CT should be
considered for follow-up.
2. Mild bilateral interstitial prominence. This may be chronic. An
active interstitial process cannot be completely excluded.

## 2020-03-04 MED ORDER — LIDOCAINE 5 % EX PTCH
1.0000 | MEDICATED_PATCH | CUTANEOUS | Status: DC
  Administered 2020-03-04 – 2020-03-05 (×2): 1 via TRANSDERMAL
  Filled 2020-03-04 (×3): qty 1

## 2020-03-04 MED ORDER — SODIUM CHLORIDE 0.9% FLUSH
3.0000 mL | Freq: Two times a day (BID) | INTRAVENOUS | Status: DC
  Administered 2020-03-05: 10:00:00 3 mL via INTRAVENOUS

## 2020-03-04 MED ORDER — ACETAMINOPHEN 650 MG RE SUPP: 650.0000 mg | Freq: Four times a day (QID) | RECTAL | Status: DC | PRN

## 2020-03-04 MED ORDER — DIPHENHYDRAMINE HCL 25 MG PO CAPS
25.0000 mg | ORAL_CAPSULE | Freq: Every day | ORAL | Status: DC
  Administered 2020-03-04 – 2020-03-05 (×2): 25 mg via ORAL
  Filled 2020-03-04 (×2): qty 1

## 2020-03-04 MED ORDER — SODIUM CHLORIDE 0.9 % IV SOLN: 250.0000 mL | INTRAVENOUS | Status: DC | PRN

## 2020-03-04 MED ORDER — ROSUVASTATIN CALCIUM 20 MG PO TABS
20.0000 mg | ORAL_TABLET | Freq: Every day | ORAL | Status: DC
  Administered 2020-03-04 – 2020-03-05 (×2): 20 mg via ORAL
  Filled 2020-03-04 (×2): qty 1

## 2020-03-04 MED ORDER — ACYCLOVIR 400 MG PO TABS
400.0000 mg | ORAL_TABLET | Freq: Two times a day (BID) | ORAL | Status: DC
  Administered 2020-03-04 – 2020-03-06 (×4): 400 mg via ORAL
  Filled 2020-03-04 (×5): qty 1

## 2020-03-04 MED ORDER — ASPIRIN EC 81 MG PO TBEC
81.0000 mg | DELAYED_RELEASE_TABLET | Freq: Every day | ORAL | Status: DC
  Administered 2020-03-05 – 2020-03-06 (×2): 81 mg via ORAL
  Filled 2020-03-04 (×2): qty 1

## 2020-03-04 MED ORDER — ADULT MULTIVITAMIN W/MINERALS CH
1.0000 | ORAL_TABLET | Freq: Every day | ORAL | Status: DC
  Administered 2020-03-05 – 2020-03-06 (×2): 1 via ORAL
  Filled 2020-03-04 (×2): qty 1

## 2020-03-04 MED ORDER — DIPHENHYDRAMINE HCL (SLEEP) 25 MG PO TABS: 25.0000 mg | ORAL_TABLET | Freq: Every day | ORAL | Status: DC

## 2020-03-04 MED ORDER — PANTOPRAZOLE SODIUM 40 MG PO TBEC: 40.0000 mg | DELAYED_RELEASE_TABLET | Freq: Every day | ORAL | Status: DC

## 2020-03-04 MED ORDER — ALLOPURINOL 100 MG PO TABS
200.0000 mg | ORAL_TABLET | Freq: Every day | ORAL | Status: DC
  Administered 2020-03-04 – 2020-03-06 (×3): 200 mg via ORAL
  Filled 2020-03-04 (×3): qty 2

## 2020-03-04 MED ORDER — POLYETHYLENE GLYCOL 3350 17 G PO PACK
17.0000 g | PACK | Freq: Every day | ORAL | Status: DC | PRN
Start: 2020-03-04 — End: 2020-03-06

## 2020-03-04 MED ORDER — SODIUM CHLORIDE 0.9% FLUSH
3.0000 mL | Freq: Once | INTRAVENOUS | Status: AC
  Administered 2020-03-06: 08:00:00 3 mL via INTRAVENOUS

## 2020-03-04 MED ORDER — PANTOPRAZOLE SODIUM 40 MG PO TBEC
40.0000 mg | DELAYED_RELEASE_TABLET | Freq: Every day | ORAL | Status: DC
  Administered 2020-03-05 – 2020-03-06 (×2): 40 mg via ORAL
  Filled 2020-03-04 (×2): qty 1

## 2020-03-04 MED ORDER — SODIUM CHLORIDE 0.9% FLUSH
3.0000 mL | INTRAVENOUS | Status: DC | PRN
Start: 2020-03-04 — End: 2020-03-06

## 2020-03-04 MED ORDER — ACETAMINOPHEN 325 MG PO TABS
650.0000 mg | ORAL_TABLET | Freq: Four times a day (QID) | ORAL | Status: DC | PRN
  Administered 2020-03-05: 325 mg via ORAL
  Filled 2020-03-04: qty 2

## 2020-03-04 MED ORDER — HEPARIN SODIUM (PORCINE) 5000 UNIT/ML IJ SOLN
5000.0000 [IU] | Freq: Three times a day (TID) | INTRAMUSCULAR | Status: DC
  Administered 2020-03-04 – 2020-03-06 (×6): 5000 [IU] via SUBCUTANEOUS
  Filled 2020-03-04 (×6): qty 1

## 2020-03-04 NOTE — ED Notes (Signed)
Date and time results received: 03/04/20 * Test: Trop Critical Value: 116 Name of Provider Notified:Hoffman, Randall Hiss

## 2020-03-04 NOTE — ED Provider Notes (Signed)
[facility] EMERGENCY DEPARTMENT Provider Note   CSN: [id number] Arrival date & time: 03/04/20  1435  An emergency department physician performed an initial assessment on this suspected stroke patient at 1447.  History Chief Complaint  Patient presents with  . Altered Mental Status    Antonio Reed is a 74 y.o. male.  74 year old male with prior medical history as detailed below presents for evaluation.  Patient was with his wife picking up grandkids from school.  Patient apparently left the vehicle to move a traffic cone.  When he returned to the vehicle the patient appeared to be visibly short of breath.  The wife witnessed his increasing respiratory difficulty.  The patient then became unresponsive in the vehicle.  The wife called for help.  The patient was removed from the vehicle and bystanders began CPR.  EMS arrived.  Upon EMS arrival the patient was noted to have a pulse and to have near agonal breathing.  In the short amount of time required to transport the patient to [facility] ED the patient's respiratory status improved.  The patient became completely responsive.  Upon arrival to the ED the patient is without specific complaint.  He is alert and oriented x3.  He has transient weakness of his left arm.  This is rapidly improving during the initial evaluation.  Code stroke called upon patient's arrival to the ED.  No witnessed seizure-like activity per the wife.  Patient's without actual recorded loss of pulse.  The history is provided by the patient, the spouse and medical records.  Loss of Consciousness Episode history:  Single Most recent episode:  Today Duration:  5 minutes Timing:  Constant Progression:  Resolved Chronicity:  New Witnessed: yes   Relieved by:  Nothing Worsened by:  Nothing Ineffective treatments:  None tried Associated symptoms: focal weakness   Associated symptoms: no chest pain        No past medical history on  file.  There are no problems to display for this patient.     No family history on file.     Home Medications Prior to Admission medications   Medication Sig Start Date End Date Taking? Authorizing Provider  acyclovir (ZOVIRAX) 400 MG tablet Take 400 mg by mouth 2 (two) times daily. 02/22/20  Yes [provider]  allopurinol (ZYLOPRIM) 100 MG tablet Take 200 mg by mouth daily. 01/08/20  Yes [provider]  aspirin EC 81 MG tablet Take 81 mg by mouth daily. Swallow whole.   Yes [provider]  Cholecalciferol 125 MCG (5000 UT) TABS Take 5,000 Units by mouth daily.   Yes [provider]  cyclophosphamide (CYTOXAN) 50 MG capsule Take 550 mg by mouth once a week. Three weeks on and 1 week off. 01/24/20  Yes [provider]  dexamethasone (DECADRON) 2 MG tablet Take 10 mg by mouth once a week. On Monday after cytoxan 01/24/20  Yes [provider]  diphenhydrAMINE (SOMINEX) 25 MG tablet Take 25 mg by mouth at bedtime. For sleep   Yes [provider]  doxycycline (VIBRA-TABS) 100 MG tablet Take 100 mg by mouth 2 (two) times daily as needed. 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 Cancer spots   Yes [provider]  furosemide (LASIX) 20 MG tablet Take 20 mg by mouth 2 (two) times daily. 02/29/20  Yes [provider]  losartan (  COZAAR) 100 MG tablet Take 100 mg by mouth at bedtime. 02/12/20  Yes [provider]  metoprolol tartrate (LOPRESSOR) 25 MG tablet Take 25 mg by mouth 2 (two) times daily. 01/18/20  Yes [provider]  Multiple Vitamins-Minerals (CENTRUM SILVER 50+MEN) TABS Take 1 tablet by mouth daily.   Yes [provider]  Omega-3 Fatty Acids (FISH OIL) 1000 MG CAPS Take 1,000 mg by mouth daily.   Yes [provider]  omeprazole (PRILOSEC) 40 MG  capsule Take 40 mg by mouth daily. 12/29/19  Yes [provider]  ondansetron (ZOFRAN) 8 MG tablet Take 8 mg by mouth 3 (three) times daily as needed for nausea (before chemo treatment). 12/22/19  Yes [provider]  pimecrolimus (ELIDEL) 1 % cream Apply 1 application topically daily.   Yes [provider]  rosuvastatin (CRESTOR) 20 MG tablet Take 20 mg by mouth at bedtime. 02/12/20  Yes [provider]    Allergies    Atorvastatin and Contrast media [iodinated diagnostic agents]  Review of Systems   Review of Systems  Cardiovascular: Positive for syncope. Negative for chest pain.  Neurological: Positive for focal weakness.  All other systems reviewed and are negative.   Physical Exam Updated Vital Signs BP (!) 112/55   Pulse 91   Temp 97.6 F (36.4 C) (Temporal)   Resp 15   Ht 5\' 10"  (1.778 m)   Wt 63.5 kg   SpO2 99%   BMI 20.09 kg/m   Physical Exam Vitals and nursing note reviewed.  Constitutional:      General: He is not in acute distress.    Appearance: He is well-developed and well-nourished.  HENT:     Head: Normocephalic and atraumatic.     Mouth/Throat:     Mouth: Oropharynx is clear and moist.  Eyes:     Extraocular Movements: EOM normal.     Conjunctiva/sclera: Conjunctivae normal.     Pupils: Pupils are equal, round, and reactive to light.  Cardiovascular:     Rate and Rhythm: Normal rate and regular rhythm.     Heart sounds: Normal heart sounds.  Pulmonary:     Effort: Pulmonary effort is normal. No respiratory distress.     Breath sounds: Normal breath sounds.  Abdominal:     General: There is no distension.     Palpations: Abdomen is soft.     Tenderness: There is no abdominal tenderness.  Musculoskeletal:        General: No deformity or edema. Normal range of motion.     Cervical back: Normal range of motion and neck supple.  Skin:    General: Skin is warm and dry.  Neurological:     Mental Status: He is alert  and oriented to person, place, and time.     Comments: AOx3 Responds appropriately to questioning.  Mild weakness noted of the left arm which rapidly improves during the exam.  No other significant weakness or abnormal neuro findings on exam.  Psychiatric:        Mood and Affect: Mood and affect normal.     ED Results / Procedures / Treatments   Labs (all labs ordered are listed, but only abnormal results are displayed) Labs Reviewed  CBC - Abnormal; Notable for the following components:      Result Value   WBC 10.8 (*)    RBC 2.70 (*)    Hemoglobin 9.0 (*)    HCT 27.3 (*)    MCV 101.1 (*)  RDW 18.5 (*)    nRBC 0.6 (*)    All other components within normal limits  COMPREHENSIVE METABOLIC PANEL - Abnormal; Notable for the following components:   CO2 21 (*)    Glucose, Bld 142 (*)    BUN 29 (*)    Creatinine, Ser 2.01 (*)    Calcium 8.3 (*)    Total Protein 5.2 (*)    Albumin 2.6 (*)    AST 137 (*)    ALT 95 (*)    Alkaline Phosphatase 312 (*)    GFR, Estimated 34 (*)    All other components within normal limits  URINALYSIS, ROUTINE W REFLEX MICROSCOPIC - Abnormal; Notable for the following components:   Protein, ur 100 (*)    All other components within normal limits  CBG MONITORING, ED - Abnormal; Notable for the following components:   Glucose-Capillary 128 (*)    All other components within normal limits  I-STAT CHEM 8, ED - Abnormal; Notable for the following components:   BUN 30 (*)    Creatinine, Ser 1.90 (*)    Glucose, Bld 136 (*)    Calcium, Ion 1.07 (*)    TCO2 20 (*)    Hemoglobin 8.5 (*)    HCT 25.0 (*)    All other components within normal limits  RESP PANEL BY RT-PCR (FLU A&B, COVID) ARPGX2  RESP PANEL BY RT-PCR (FLU A&B, COVID) ARPGX2  URINE CULTURE  CULTURE, BLOOD (ROUTINE X 2)  CULTURE, BLOOD (ROUTINE X 2)  PROTIME-INR  APTT  DIFFERENTIAL  BRAIN NATRIURETIC PEPTIDE  CBG MONITORING, ED  TROPONIN I (HIGH SENSITIVITY)    EKG EKG  Interpretation  Date/Time:  Friday March 04 2020 14:44:43 EST Ventricular Rate:  92 PR Interval:    QRS Duration: 105 QT Interval:  361 QTC Calculation: 447 R Axis:   -62 Text Interpretation: Sinus rhythm Prolonged PR interval Probable left atrial enlargement LAD, consider left anterior fascicular block LVH with secondary repolarization abnormality Confirmed by Dene Gentry [phone]) on 03/04/2020 3:09:01 PM   Radiology DG Chest Port 1 View  Result Date: 03/04/2020 CLINICAL DATA:  Shortness of breath. EXAM: PORTABLE CHEST 1 VIEW COMPARISON:  CT chest report 08/15/2011. FINDINGS: Prior median sternotomy and cardiac valve replacement. Thoracic aortic prominence. Thoracic aortic aneurysm was noted by report on prior CT of 08/15/2011. Repeat contrast-enhanced chest CT should be considered for follow-up. Mild bilateral interstitial prominence. This may be chronic. An active interstitial process cannot be completely excluded. No pleural effusion or pneumothorax. Left costophrenic angle incompletely imaged. Degenerative change thoracic spine. IMPRESSION: 1. Prior median sternotomy and cardiac valve replacement. Thoracic aortic prominence. Thoracic aortic aneurysm was noted by report on prior CT of 08/15/2011. Repeat contrast-enhanced chest CT should be considered for follow-up. 2. Mild bilateral interstitial prominence. This may be chronic. An active interstitial process cannot be completely excluded. Electronically Signed   By: Marcello Moores  Register   On: 03/04/2020 16:15   CT HEAD CODE STROKE WO CONTRAST  Result Date: 03/04/2020 CLINICAL DATA:  Code stroke.  Left-sided weakness. EXAM: CT HEAD WITHOUT CONTRAST TECHNIQUE: Contiguous axial images were obtained from the base of the skull through the vertex without intravenous contrast. COMPARISON:  None. FINDINGS: Brain: Mild atrophy and white matter hypoattenuation present. Basal ganglia are intact. Insular ribbon is normal bilaterally. No acute or  cortical abnormality is present. The ventricles are proportionate to the degree of atrophy. Is The brainstem and cerebellum are within normal limits. Vascular: Atherosclerotic calcifications are present within the  cavernous internal carotid arteries bilaterally. No hyperdense vessel is present. Skull: Insert normal skull No significant extracranial soft tissue lesion is present. Sinuses/Orbits: The paranasal sinuses and mastoid air cells are clear. Bilateral lens replacements are noted. Globes and orbits are otherwise unremarkable. ASPECTS [facility] Stroke Program Early CT Score) - Ganglionic level infarction (caudate, lentiform nuclei, internal capsule, insula, M1-M3 cortex): 7/7 - Supraganglionic infarction (M4-M6 cortex): 3/3 Total score (0-10 with 10 being normal): 10/10 IMPRESSION: 1. No acute intracranial abnormality. 2. Mild atrophy and white matter disease likely reflects the sequela of chronic microvascular ischemia. 3. Aspects 10/10 The above was relayed via text pager to Dr. Roland Rack on 03/04/2020 at 15:02 . Electronically Signed   By: San Morelle M.D.   On: 03/04/2020 15:03    Procedures Procedures (including critical care time)  CRITICAL CARE Performed by: Valarie Merino   Total critical care time: 30  minutes  Critical care time was exclusive of separately billable procedures and treating other patients.  Critical care was necessary to treat or prevent imminent or life-threatening deterioration.  Critical care was time spent personally by me on the following activities: development of treatment plan with patient and/or surrogate as well as nursing, discussions with consultants, evaluation of patient's response to treatment, examination of patient, obtaining history from patient or surrogate, ordering and performing treatments and interventions, ordering and review of laboratory studies, ordering and review of radiographic studies, pulse oximetry and re-evaluation of  patient's condition.   Medications Ordered in ED Medications  sodium chloride flush (NS) 0.9 % injection 3 mL (3 mLs Intravenous Not Given 03/04/20 1520)    ED Course  I have reviewed the triage vital signs and the nursing notes.  Pertinent labs & imaging results that were available during my care of the patient were reviewed by me and considered in my medical decision making (see chart for details).    MDM Rules/Calculators/A&P                          MDM  Screen complete  Antonio Reed was evaluated in Emergency Department on 03/04/2020 for the symptoms described in the history of present illness. He was evaluated in the context of the global COVID-19 pandemic, which necessitated consideration that the patient might be at risk for infection with the SARS-CoV-2 virus that causes COVID-19. Institutional protocols and algorithms that pertain to the evaluation of patients at risk for COVID-19 are in a state of rapid change based on information released by regulatory bodies including the CDC and federal and state organizations. These policies and algorithms were followed during the patient's care in the ED.  Patient is presenting for evaluation after witnessed syncopal event.  Patient apparently had significant respiratory distress leading to possible respiratory arrest.  Bystanders performed CPR.  No reported loss of pulse was recorded.  Patient's mental status improved rapidly prior to arrival at the facility.  Upon arrival to the facility patient was alert and oriented.  He was noted to have transient weakness of the left arm.  Code stroke was initiated.  Patient rapidly returned to baseline.  Patient is currently without significant acute complaint after initial work-up.  Neuro team is involved and will follow as consult.  Medicine service will admit.  Final Clinical Impression(s) / ED Diagnoses Final diagnoses:  Syncope, unspecified syncope type    Rx / DC Orders ED Discharge  Orders    None       Miriah Maruyama,  Wallis Bamberg, MD 03/04/20 [phone]

## 2020-03-04 NOTE — ED Notes (Signed)
[facility] called/Activiate Code Stroke @ 1446-per Lovena Le, RN called by Levada Dy

## 2020-03-04 NOTE — Consult Note (Signed)
Neurology Consultation  Reason for Consult: AMS in the field, left arm weakness. CODE STROKE Referring Physician: Hart Robinsons   CC: CODE STROKE  History is obtained from: patient, wife, ED MD  HPI: Antonio Reed is a 74 y.o. male with a PMHx of CAD, anemia, HTN, AV replacement 2016, Monoclonal gammopathy, light chain amyloidosis who presents to the [facility] ED after incident in the field. Upon arrival, pt was found immediately to have left arm weakness and CODE STROKE was activated. Upon activation, pt was met in the CT scanner and was already able to move his LUE. CT head was negative for infarct.   Event hx obtained from EDP and wife. Today, while out to pick up his grandchildren from school, patient got out of car to move some parking cones. Upon returning to car, he c/o being SOB. Per wife he turned blue and would not answer questions. He went unresponsive and agonal. Bystanders did 4 rounds of CPR and "got him back" per wife. Pt was brought to ED after this event of AMS, ? Syncope, ? Arrest. Upon arrival in ED he was alert and talking but his left arm was very weak.   Per wife, he has had no med changes lately, no hx seizures or stroke.   He normally gets his medical care at [facility].   LKW: about 1300  tpa given?: no, not indicated.   ROS: No HA, n/v, seizure like activity at the scene. Rest per HPI.   No past medical history on file. Amyloidosis, CAD, HTN, anemia, aortic valve replacement 2016  No family history on file.  Social History:  No tobacco use. ETOH 1-4 drinks a month.   Medications  Current Facility-Administered Medications:  .  sodium chloride flush (NS) 0.9 % injection 3 mL, 3 mL, Intravenous, Once, Messick, Wallis Bamberg, MD No current outpatient medications on file.  Exam: Current vital signs: BP (!) 122/53   Pulse 98   Temp 97.6 F (36.4 C) (Temporal)   Resp 15   SpO2 100%  Vital signs in last 24 hours: Temp:  [97.6 F (36.4 C)] 97.6 F (36.4 C) (12/10 1500) Pulse  Rate:  [98] 98 (12/10 1500) Resp:  [15] 15 (12/10 1500) BP: (122)/(53) 122/53 (12/10 1500) SpO2:  [100 %] 100 % (12/10 1500)  GENERAL: Awake, alert in NAD HEENT: - Normocephalic and atraumatic, dry mm, no LN++, no Thyromegaly LUNGS - Normal respiratory effort. SaO2 normal CV - RRR on tele ABDOMEN - Soft, nontender Ext: warm, well perfused  NEURO:  Mental Status: AA&Ox3  Speech/Language: speech is fluent. Naming, repetition, fluency, and comprehension intact. Cranial Nerves:  II: PERRL 88m/brisk. visual fields full.  III, IV, VI: EOMI. Lid elevation symmetric and full.  V: sensation is intact and symmetrical to face. Blinks to threat. Moves jaw back and forth.  VII: Smile is symmetrical. Able to puff cheeks and raise eyebrows.  VIII:hearing intact to voice IX, X: palate elevation is symmetric. Phonation normal.  XI: normal sternocleidomastoid and trapezius muscle strength XFKC:LEXNTZis symmetrical without fasciculations.   Motor: 5/5 strength is all muscle groups.  Tone is normal. Bulk is normal.  Sensation- Intact to light touch bilaterally in all four extremities. Extinction intact. 2 point discrimination. Sharp/dull/vibration.  Coordination: FTN intact bilaterally.  No drift UE/LEs.  Gait- deferred  1a Level of Conscious.: 0 1b LOC Questions: 0 1c LOC Commands: 0 2 Best Gaze: 0 3 Visual: 0 4 Facial Palsy: 0 5a Motor Arm - left: 0 5b Motor  Arm - Right: 0 6a Motor Leg - Left: 0 6b Motor Leg - Right: 0 7 Limb Ataxia: 0 8 Sensory: 0 9 Best Language: 0 10 Dysarthria: 0 11 Extinct. and Inatten.: 0 TOTAL: 0  Labs I have reviewed labs in epic and the results pertinent to this consultation are:  CBC    Component Value Date/Time   WBC 10.8 (H) 03/04/2020 1444   RBC 2.70 (L) 03/04/2020 1444   HGB 8.5 (L) 03/04/2020 1452   HCT 25.0 (L) 03/04/2020 1452   PLT 176 03/04/2020 1444   MCV 101.1 (H) 03/04/2020 1444   MCH 33.3 03/04/2020 1444   MCHC 33.0 03/04/2020 1444    RDW 18.5 (H) 03/04/2020 1444   LYMPHSABS 3.4 03/04/2020 1444   MONOABS 1.0 03/04/2020 1444   EOSABS 0.2 03/04/2020 1444   BASOSABS 0.1 03/04/2020 1444    CMP     Component Value Date/Time   NA 137 03/04/2020 1452   K 3.9 03/04/2020 1452   CL 105 03/04/2020 1452   GLUCOSE 136 (H) 03/04/2020 1452   BUN 30 (H) 03/04/2020 1452   CREATININE 1.90 (H) 03/04/2020 1452       Imaging I have reviewed the images obtained:  CT-scan of the brain1. No acute intracranial abnormality. 2. Mild atrophy and white matter disease likely reflects the sequela of chronic microvascular ischemia. 3. Aspects 10/10  Assessment: 74 yo male who presented to [facility] ED after possible arrest in a parking lot with subsequent left-sided weakness as he was coming around.  The description from his wife sounds much more consistent with arrest/syncope rather than a primary neurological event.  Possibilities for focal symptoms after a loss of consciousness event included asymmetric hypo-perfusion due to either intracranial or carotid stenosis, Todd's phenomenon after focal seizure.    Recommendations: -MRA of the head and neck, MRI brain -EEG  Pt seen by Clance Boll, NP/Neuro and lby MD. Note/plan to be edited by MD as needed.  Pager: [phone]   I have seen the patient reviewed the above note.  His exam was nonfocal by the time of my evaluation with the exception of giving November as a month rather than December.  I suspect that the primary driver is cerebral hypoperfusion due to either cardiac arrest, hypoxia, or other cardiovascular event.  Given the focal findings as he was coming to, it is reasonable to assess for focal stenosis that could contribute to this, but even if we find severe carotid disease, etc., I do not think it was the primary driver of his events today.  Roland Rack, MD [facility] [phone]  If 7pm- 7am, please page neurology on call as listed in [facility].

## 2020-03-04 NOTE — H&P (Signed)
Date: 03/04/2020               Patient Name:  Antonio Reed MRN: [id number]  DOB: 23-Dec-1945 Age / Sex: 74 y.o., male   PCP: Patient, No Pcp Per         Medical Service: Internal Medicine Teaching Service         Attending Physician: Dr. Heber [place], Rachel Moulds, DO    First Contact: Dr. Demaris Callander Pager: [phone]  Second Contact: Dr. Charleen Kirks Pager: [phone]       After Hours (After 5p/  First Contact Pager: [phone]  weekends / holidays): Second Contact Pager: [phone]   Chief Complaint: Transient LOC  History of Present Illness: 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   Patient states he went to pick up his grandchildren from school, got out of car to move some parking cones. He had Shortness of Breath upon return to car. Wife noted he was unresponsive, but did not see any seizure like activity. Bystanders did 4 rounds of CPR on roadside. There is no reported loss of pulse. EMS was called and on their arrival patient was alert, oriented, awake but felt weak in the left arm.  Wife states he had similar episode last month during exertion, had shortness of breath.   Notes recent weight loss due to chemotherapy and was advised to decrease his Losartan to 50 mg but has not started the new dose yet, no recent change in medications. Denies any recent cough, headache, abdominal pain, diarrhea, nausea, vomiting, fever or chills.   Meds:  . acyclovir (ZOVIRAX) 400 MG tablet Take 1 tablet (400 mg total) by mouth 2 times daily. For VZV prophylaxis 60 tablet 11  . allopurinoL (ZYLOPRIM) 100 MG tablet Take 1 tablet by mouth twice daily (Patient taking differently: 200 mg daily. ) 180 tablet 1  . amoxicillin (AMOXIL) 500 MG capsule TAKE FOUR CAPSULES BY MOUTH ONE HOUR BEFORE  APPOINTMENT  . aspirin 81 MG EC tablet *ANTIPLATELET* Take 1 tablet (81 mg total) by mouth daily. Hold for 1 week 30 tablet  . calcipotriene (DOVONOX) 0.005 % cream APPLY 5 DAYS A WEEK FOR 8 WEEKS WITH EFUDEX  . cholecalciferol, vitamin D3, (VITAMIN D3) 5,000 unit Tab Take 1 tablet by mouth daily.  . cyclophosphamide (CYTOXAN) 50 mg capsule Take 11 capsules (550 mg total) by mouth once a week. Take on days 1, 8, and 15 of 28-day cycle to correspond with bortezomib given in clinic. Do not chew, crush or cut. Take during or after a meal in the morning. 33 capsule 1  . dexAMETHasone (DECADRON) 2 MG tablet Take 10 mg (5 tablets) the day after each daratumumab injection. 20 tablet 1  . diphenhydrAMINE (BENADRYL) 25 mg tablet Take 25 mg by mouth nightly as needed for Itching.  Marland Kitchen doxycycline hyclate 100 MG tablet Take 1 tablet (100 mg total) by mouth 2 times daily. 60 tablet 3  . fluorouracil (EFUDEX) 5 % cream Apply topically 2 times daily.  . furosemide (LASIX) 20 MG tablet TAKE ONE TABLET BY MOUTH TWICE A DAY 180 tablet 0  . hydrALAZINE (APRESOLINE) 25 MG tablet Take 1 tablet (25 mg total) by mouth 2 times daily. 60 tablet 3  . imiquimod (ALDARA) 5 % cream Apply 1 packet topically 3 (three) times  a week.  . losartan (COZAAR) 100 MG tablet TAKE 1/2 TABLET BY MOUTH ONE TIME DAILY 30 tablet 6  . metoclopramide HCl (REGLAN) 5 MG tablet Take 1 tabs every 8 hours as needed for nausea or hiccups 30 tablet 0  . metoPROLOL tartrate (LOPRESSOR) 25 MG tablet Take 1 tablet (25 mg total) by mouth 2 times daily. 180 tablet 1  . multivit-min/FA/lycopen/lutein (CENTRUM SILVER MEN ORAL) Take by mouth.  . MULTIVITAMIN W-MINERALS/LUTEIN (CENTRUM SILVER ORAL) Take 1 tablet by mouth every morning.  Marland Kitchen OMEGA-3 FATTY ACIDS (FISH OIL CONCENTRATE ORAL) Take 1,000 mg by mouth daily.  Marland Kitchen omeprazole (PRILOSEC) 40 MG capsule TAKE ONE CAPSULE BY MOUTH ONE TIME DAILY 30 MINUTES BEFORE BREAKFAST 90 capsule 3  . ondansetron (ZOFRAN) 8  MG tablet Take 1 tablet (8 mg) by mouth once weekly prior to cyclophosphamide dose and every 8 hours as needed for nausea and vomiting 30 tablet 5  . pimecrolimus (ELIDEL) 1 % cream Apply 1 application topically daily.  . rosuvastatin (CRESTOR) 20 MG tablet Take 1 tablet (20 mg total) by mouth daily. 90 tablet 1  . sildenafiL (VIAGRA) 100 MG tablet Take 1 tablet (100 mg total) by mouth as needed for Erectile Dysfunction. 10 tablet 5  . [DISCONTINUED] losartan (COZAAR) 100 MG tablet TAKE ONE TABLET BY MOUTH ONE TIME DAILY 30 tablet 6   Allergies: Allergies as of 03/04/2020 - Review Complete 03/04/2020  Allergen Reaction Noted  . Atorvastatin Other (See Comments) 01/28/2017  . Contrast media [iodinated diagnostic agents] Rash 04/23/2012   No past medical history on file.  Family History: No family history on file.  Social History: Lives with wife. No tobacco, alcohol or any drug abuse.  Review of Systems: A complete ROS was negative except as per HPI.   Physical Exam: Blood pressure (!) 113/55, pulse 86, temperature 97.6 F (36.4 C), temperature source Temporal, resp. rate 13, height 5\' 10"  (1.778 m), weight 63.5 kg, SpO2 97 %.  Physical Exam Constitutional:      Appearance: Normal appearance.  HENT:     Head: Normocephalic and atraumatic.     Mouth/Throat:     Comments: Ecchymosis on nose Eyes:     Extraocular Movements: Extraocular movements intact.     Conjunctiva/sclera: Conjunctivae normal.     Pupils: Pupils are equal, round, and reactive to light.  Cardiovascular:     Rate and Rhythm: Normal rate and regular rhythm.     Pulses: Normal pulses.          Dorsalis pedis pulses are 2+ on the right side and 2+ on the left side.     Heart sounds: Murmur (holosystolic murmur heard throughout, but best at the RUSB) heard.   Systolic murmur is present.   Pulmonary:     Effort: Pulmonary effort is normal.     Breath sounds: Normal breath sounds.  Abdominal:     General:  Bowel sounds are normal.     Palpations: Abdomen is soft.  Musculoskeletal:        General: Normal range of motion.     Cervical back: Normal range of motion.     Right lower leg: No edema.     Left lower leg: No edema.  Skin:    General: Skin is warm and dry.  Neurological:     Mental Status: He is alert and oriented to person, place, and time.     Cranial Nerves: Cranial nerves are intact. No dysarthria or facial asymmetry.  Sensory: Sensation is intact.     Motor: Motor function is intact.     Comments: 5/5 muscle strength in all muscle groups   Psychiatric:        Mood and Affect: Mood normal.        Behavior: Behavior normal.        Thought Content: Thought content normal.        Judgment: Judgment normal.    EKG: personally reviewed my interpretation is Sinus rhythm, Prolonged PR interval, Biatrial enlargement Left anterior fascicular block, LVH with secondary repolarization abnormality.  CXR:   IMPRESSION: 1. Prior median sternotomy and cardiac valve replacement. Thoracic aortic prominence. Thoracic aortic aneurysm was noted by report on prior CT of 08/15/2011. Repeat contrast-enhanced chest CT should be considered for follow-up. 2. Mild bilateral interstitial prominence. This may be chronic. An active interstitial process cannot be completely excluded.  CT Head  IMPRESSION: 1. No acute intracranial abnormality. 2. Mild atrophy and white matter disease likely reflects the sequela of chronic microvascular ischemia. 3. Aspects 10/10  Assessment & Plan by Problem: Active Problems:   Transient loss of consciousness   #Transient Loss of consciousness  As per wife who was present during events, patient became unresponsive after moving/picking cones and walked back to car. Chest compressions done roadside by bystanders; pulses not checked beforehand. On EMS arrival, patient had pulses but slow breathing and altered. Wife does not report any seizure like activity.  Work up so far revealed no electrolyte abnormalities to explain LOC. CT head/MRI head does not show any acute intracranial abnormality. Given new decrease in BP and poor PO intake, may be orthostatic versus vasovagal. However, this is patient's second similar presentation after exertion and may be concerning for ischemia. EKG shows prolonged PR and QT interval but has motion degradations, so repeating EKG and also waiting on Troponin's and BNP. IF abnormal, will consult Cards. Recent Echo on 03/01/20 showed EF 60-65%, so not planning to repeat it again. Mild leukocytosis however very low suspicion for infection; blood cultures ordered by EDP.   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 Bone marrow +. Received cyclophosphamide, bortezomib and dexamethasone and currently on dara +CyBorD.  He remains on prophylactic acyclovir.  # Hx of Hypertension Given patient's LOC and recent hypotension noted at home, will hold home Losartan and Metoprolol. 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   Dispo: Admit patient to Observation with expected length of stay less than 2 midnights.  Signed: Honor Junes, MD 03/04/2020, 7:42 PM  Pager: [phone] After 5pm on weekdays and 1pm on weekends: On Call pager: [phone]

## 2020-03-04 NOTE — ED Notes (Signed)
Pt still on MRI unable to complete VS at this time.

## 2020-03-04 NOTE — Code Documentation (Signed)
NIHSS 0 after reassessment. MD Reported TIA alert. Care PLan: q2 HR mNIHSS/VS. Handoff given to Garlon Hatchet, Therapist, sports.

## 2020-03-04 NOTE — ED Notes (Signed)
Patient transported to MRI 

## 2020-03-04 NOTE — ED Triage Notes (Signed)
Pt bib gcems for AMS, syncope. Pt was in the car rider line picking up his grandkids when he went out to move some cones, got back in the car and felt SOB, turned blue per pts wife. Pt then went unresponsive and agonal. Bystanders did 4 rounds of CPR. When ems arrived pt was still unresponsive and agonal but in NSR on the monitor. When pt arrived to ED he was alert and talking. BP 118/58 HR 90 CBG 149.

## 2020-03-04 NOTE — Code Documentation (Signed)
Stroke Response Nurse Documentation Code Documentation  Kendel Bessey is a 74 y.o. male arriving to [place] ED via [facility] on 12/10. Code stroke was activated by  ED after the patient was noted to have some left sided weakness. Patient from grandchildren's school where he was LKW at 1330. Patient had moved some cones in the parking lot while waiting for his grandchildren. HE was talking to his wife when he got SOB turned blue and become unresponsive. CPR started and EMS called.   EMS found patient in NRS on monitor. Upon arrival to the ED, patient was flaccid on the left side and was coming around. Pt started to resolve, but Code Stroke called.    Stroke team at the bedside in CT. Labs drawn and patient cleared for CT by Dr. Jeanell Sparrow. Patient to CT with team. NIHSS 1, see documentation for details and code stroke times. Patient with disoriented on exam. The following imaging was completed:  CT. Patient is not a candidate for tPA due to too mild to treat at this point. Care/Plan: Reassess.   Kathrin Greathouse  Stroke Response RN

## 2020-03-05 ENCOUNTER — Observation Stay (HOSPITAL_COMMUNITY): Payer: Medicare PPO

## 2020-03-05 ENCOUNTER — Observation Stay (HOSPITAL_BASED_OUTPATIENT_CLINIC_OR_DEPARTMENT_OTHER): Payer: Medicare PPO

## 2020-03-05 ENCOUNTER — Encounter (HOSPITAL_COMMUNITY): Payer: Self-pay | Admitting: Internal Medicine

## 2020-03-05 DIAGNOSIS — Q231 Congenital insufficiency of aortic valve: Secondary | ICD-10-CM

## 2020-03-05 DIAGNOSIS — E8581 Light chain (AL) amyloidosis: Secondary | ICD-10-CM

## 2020-03-05 DIAGNOSIS — I712 Thoracic aortic aneurysm, without rupture: Secondary | ICD-10-CM

## 2020-03-05 DIAGNOSIS — I10 Essential (primary) hypertension: Secondary | ICD-10-CM | POA: Diagnosis not present

## 2020-03-05 DIAGNOSIS — I351 Nonrheumatic aortic (valve) insufficiency: Secondary | ICD-10-CM

## 2020-03-05 DIAGNOSIS — R55 Syncope and collapse: Principal | ICD-10-CM

## 2020-03-05 LAB — CBC
HCT: 21.8 % — ABNORMAL LOW (ref 39.0–52.0)
HCT: 24.5 % — ABNORMAL LOW (ref 39.0–52.0)
Hemoglobin: 7.6 g/dL — ABNORMAL LOW (ref 13.0–17.0)
Hemoglobin: 8.2 g/dL — ABNORMAL LOW (ref 13.0–17.0)
MCH: 34.5 pg — ABNORMAL HIGH (ref 26.0–34.0)
MCH: 33.3 pg (ref 26.0–34.0)
MCHC: 34.9 g/dL (ref 30.0–36.0)
MCHC: 33.5 g/dL (ref 30.0–36.0)
MCV: 99.1 fL (ref 80.0–100.0)
MCV: 99.6 fL (ref 80.0–100.0)
Platelets: 165 10*3/uL (ref 150–400)
Platelets: 191 10*3/uL (ref 150–400)
RBC: 2.2 MIL/uL — ABNORMAL LOW (ref 4.22–5.81)
RBC: 2.46 MIL/uL — ABNORMAL LOW (ref 4.22–5.81)
RDW: 18.3 % — ABNORMAL HIGH (ref 11.5–15.5)
RDW: 18.1 % — ABNORMAL HIGH (ref 11.5–15.5)
WBC: 7.4 10*3/uL (ref 4.0–10.5)
WBC: 8.9 10*3/uL (ref 4.0–10.5)
nRBC: 0.8 % — ABNORMAL HIGH (ref 0.0–0.2)
nRBC: 0.6 % — ABNORMAL HIGH (ref 0.0–0.2)

## 2020-03-05 LAB — ABO/RH: ABO/RH(D): A POS

## 2020-03-05 LAB — COMPREHENSIVE METABOLIC PANEL
ALT: 68 U/L — ABNORMAL HIGH (ref 0–44)
AST: 92 U/L — ABNORMAL HIGH (ref 15–41)
Albumin: 2.1 g/dL — ABNORMAL LOW (ref 3.5–5.0)
Alkaline Phosphatase: 238 U/L — ABNORMAL HIGH (ref 38–126)
Anion gap: 7 (ref 5–15)
BUN: 23 mg/dL (ref 8–23)
CO2: 23 mmol/L (ref 22–32)
Calcium: 8.1 mg/dL — ABNORMAL LOW (ref 8.9–10.3)
Chloride: 106 mmol/L (ref 98–111)
Creatinine, Ser: 1.23 mg/dL (ref 0.61–1.24)
GFR, Estimated: >60 mL/min (ref >60)
Glucose, Bld: 101 mg/dL — ABNORMAL HIGH (ref 70–99)
Potassium: 3.8 mmol/L (ref 3.5–5.1)
Sodium: 136 mmol/L (ref 135–145)
Total Bilirubin: 0.5 mg/dL (ref 0.3–1.2)
Total Protein: 4.4 g/dL — ABNORMAL LOW (ref 6.5–8.1)

## 2020-03-05 LAB — FERRITIN: Ferritin: 254 ng/mL (ref 24–336)

## 2020-03-05 LAB — ECHOCARDIOGRAM COMPLETE
AR max vel: 1.38 cm2
AV Area VTI: 1.56 cm2
AV Area mean vel: 1.34 cm2
AV Mean grad: 19 mmHg
AV Peak grad: 35.4 mmHg
Ao pk vel: 2.98 m/s
Area-P 1/2: 4.15 cm2
Height: 70 in
P 1/2 time: 410 msec
S' Lateral: 2.8 cm
Weight: 2240 oz

## 2020-03-05 LAB — VITAMIN B12: Vitamin B-12: 302 pg/mL (ref 180–914)

## 2020-03-05 LAB — FOLATE: Folate: 25.2 ng/mL (ref >5.9)

## 2020-03-05 LAB — TROPONIN I (HIGH SENSITIVITY): Troponin I (High Sensitivity): 113 ng/L (ref <18)

## 2020-03-05 NOTE — Progress Notes (Signed)
Subjective: HD#1 Overnight reported drop in Hb. Antonio Reed  evaluated at bedside this AM. He endorses some leg cramps with movement but this is chronic and unchanged. He denies any chest pain, difficult breathing, abdominal pain, N/V, extremity pain.   He notes that his hemoglobin is usually around 9 and he has it checked regularly.   Objective:  Vital signs in last 24 hours: Vitals:   03/05/20 1100 03/05/20 1115 03/05/20 1130 03/05/20 1200  BP: (!) 113/53  130/60 115/60  Pulse: 83 86 98 89  Resp: 17 17 (!) 21 15  Temp:   97.8 F (36.6 C)   TempSrc:   Oral   SpO2: 97% 98% 96% 96%  Weight:      Height:       CBC Latest Ref Rng & Units 03/05/2020 03/04/2020 03/04/2020  WBC 4.0 - 10.5 K/uL 7.4 - 10.8(H)  Hemoglobin 13.0 - 17.0 g/dL 7.6(L) 8.5(L) 9.0(L)  Hematocrit 39.0 - 52.0 % 21.8(L) 25.0(L) 27.3(L)  Platelets 150 - 400 K/uL 165 - 176   BMP Latest Ref Rng & Units 03/05/2020 03/04/2020 03/04/2020  Glucose 70 - 99 mg/dL 101(H) 136(H) 142(H)  BUN 8 - 23 mg/dL 23 30(H) 29(H)  Creatinine 0.61 - 1.24 mg/dL 1.23 1.90(H) 2.01(H)  Sodium 135 - 145 mmol/L 136 137 138  Potassium 3.5 - 5.1 mmol/L 3.8 3.9 4.0  Chloride 98 - 111 mmol/L 106 105 104  CO2 22 - 32 mmol/L 23 - 21(L)  Calcium 8.9 - 10.3 mg/dL 8.1(L) - 8.3(L)   PHYSICAL EXAM:  General: Well developed, well nourished male, lying comfortably in bed, NAD HEENT: [place]/AT, MMM, Ecchymosis on nose. CV: RRR, S1, S2 normal, Holosystolic murmur, chest tenderness to palaption Abdominal: BS+ Neuro: AAO*3 Psych: Normal mood and affect, Normal thought content, normal judgement.  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   Active Problems:   Transient loss of consciousness  #Transient Loss of  consciousness  Orthostatics taken today and shows elevated HR but no hypotension. EKG is reassuring.Planning to decrease his anti-hypertensive regimen and have close follow up. Advised by PCP to decrease his Losartan in half, but did not want to do it without consulting his cardiologist. Consulted cards due to either increase in AR or if needed ischemic work up any further, also concerning their hesitancy to decrease BP medications.  Recently started chemotherapy and if it could be medication induced, possibly due to Bortezomib.   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 Bone marrow +. Received cyclophosphamide, bortezomib and dexamethasone and currently on dara +CyBorD.  He remains on prophylactic acyclovir.  # Hx of Hypertension Given patient's LOC and recent hypotension noted at home, will hold home Losartan and Metoprolol. 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 No signs of active bleeding, will repeat again to clarify it further.   -CBC    Prior to Admission  Living Arrangement: Home Anticipated Discharge Location:Home Barriers to Discharge: ongoing management Dispo: Anticipated discharge in approximately 1 day(s).   Honor Junes, MD 03/05/2020, 12:10 PM Pager: [phone] After 5pm on weekdays and 1pm on weekends: On Call pager [phone]

## 2020-03-05 NOTE — Care Management Obs Status (Signed)
[facility] NOTIFICATION   Patient Details  Name: Antonio Reed MRN: [id number] Date of Birth: 1945-05-29   Medicare Observation Status Notification Given:  Yes    Verdell Carmine, RN 03/05/2020, 2:08 PM

## 2020-03-05 NOTE — Care Management (Signed)
Patient states he does have a PCP, his old PCP retired, his new who he has seen twice is Therapist, occupational. FU placed in patient instructions.

## 2020-03-05 NOTE — ED Notes (Signed)
Breakfast Ordered 

## 2020-03-05 NOTE — ED Notes (Signed)
Pt transported to echo 

## 2020-03-05 NOTE — Consult Note (Addendum)
Cardiology Consultation:   Patient ID: Antonio Reed MRN: [id number]; DOB: Apr 29, 1945   Admission date: 03/04/2020  Primary Care Provider: Patient, No Pcp Per [place] Cardiologist: No primary care provider on file.  Sees Abran Richard [facility] Electrophysiologist:  None   Chief Complaint:  Syncope/Loss of Consciousness  Patient Profile:   Antonio Reed is a 74 y.o. 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    History of Present Illness:   Patient notes that he had been feeling fine prior to chemotherapy.  Has had no chest pain, chest pressure, chest tightness, chest stinging; he was able to ambulate and do chores with his wife without DOE, No PND or orthopnea.  No weight gain, leg swelling , or abdominal swelling.  No syncope or near syncope in the past.  Patient had went to help pick up his grandchildren 03/04/20 and after picking up some traffic cones felt near syncope.  Got into care and passed out, turning white.  Taken out of the car and CPR was started and found to have ROSC.  Seen in ED and was AOX4 after some time of feeling dazed.  Noted to have new anemia Hgb 10->7.  Also found to have hypotension and stopped on all of his HTN meds.  Patients baseline BP had be SBP 150.  Patient notes that he initially started chemotherapy, and has felt weak since.  Wife notes an episodes of transient LOC that pre-dated this evaluation.  Has had multiple echocardiogram since 9/24 that showed various degrees or aortic regurgitation  Cardiology called for the evaluation of syncope.  Past Medical History:  Diagnosis Date  . Abnormal  number of aortic valve cusps   . Actinic keratosis   . Anemia   . Aneurysm ([place])    related to valve issue   . Aortic heart valve narrowing   . Cervical radiculopathy   . Chronic bilateral low back pain with bilateral sciatica   . Coronary artery disease   . DDD (degenerative disc disease), lumbar   . Esophageal stricture   . Facet arthritis of lumbar region   . GERD (gastroesophageal reflux disease)   . Glaucomatous atrophy (cupping) of optic disc, bilateral   . Gouty arthropathy   . Hyperlipidemia   . Hypertension   . Light chain (AL) amyloidosis (HCC)   . Lumbar radiculopathy   . Microhematuria   . Monoclonal gammopathy   . 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 Proteinuria, [place]   . Pseudophakia   . Rotator cuff arthropathy of left shoulder   . Spinal stenosis of lumbar region   . Ulnar neuropathy     Past Surgical History:  Procedure Laterality Date  . AORTIC VALVE REPLACEMENT  2016  . CARDIAC SURGERY    . CARPAL TUNNEL RELEASE Left   . EYE SURGERY       Medications Prior to Admission: Prior to Admission medications   Medication Sig Start Date End Date Taking? Authorizing Provider  acyclovir (ZOVIRAX) 400 MG tablet Take 400 mg by mouth 2 (two) times daily. 02/22/20  Yes [provider]  allopurinol (ZYLOPRIM) 100 MG tablet Take 200 mg by mouth daily. 01/08/20  Yes [provider]  aspirin EC 81 MG tablet Take 81 mg by mouth daily. Swallow whole.   Yes [provider]  Cholecalciferol 125 MCG (5000 UT) TABS Take 5,000 Units by mouth daily.   Yes [provider]  cyclophosphamide (CYTOXAN) 50 MG capsule Take 550 mg by mouth once a week. Three weeks on and 1 week off. 01/24/20  Yes [provider]  dexamethasone (DECADRON) 2 MG tablet Take 10 mg by mouth once a week. On Monday after cytoxan 01/24/20  Yes [provider]  diphenhydrAMINE (SOMINEX) 25 MG tablet Take 25 mg by mouth at  bedtime. For sleep   Yes [provider]  doxycycline (VIBRA-TABS) 100 MG tablet Take 100 mg by mouth 2 (two) times daily as needed. 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 Cancer spots   Yes [provider]  furosemide (LASIX) 20 MG tablet Take 20 mg by mouth 2 (two) times daily. 02/29/20  Yes [provider]  losartan (COZAAR) 100 MG tablet Take 100 mg by mouth at bedtime. 02/12/20  Yes [provider]  metoprolol tartrate (LOPRESSOR) 25 MG tablet Take 25 mg by mouth 2 (two) times daily. 01/18/20  Yes [provider]  Multiple Vitamins-Minerals (CENTRUM SILVER 50+MEN) TABS Take 1 tablet by mouth daily.   Yes [provider]  Omega-3 Fatty Acids (FISH OIL) 1000 MG CAPS Take 1,000 mg by mouth daily.   Yes [provider]  omeprazole (PRILOSEC) 40 MG capsule Take 40 mg by mouth daily. 12/29/19  Yes [provider]  ondansetron (ZOFRAN) 8 MG tablet Take 8 mg by mouth 3 (three) times daily as needed for nausea (before chemo treatment). 12/22/19  Yes [provider]  pimecrolimus (ELIDEL) 1 % cream Apply 1 application topically daily.   Yes [provider]  rosuvastatin (CRESTOR) 20 MG tablet Take 20 mg by mouth at bedtime. 02/12/20  Yes [provider]     Allergies:    Allergies  Allergen Reactions  . Atorvastatin Other (See Comments)    Muscle cramps  . Contrast Media [Iodinated Diagnostic Agents] Rash    Social History:   Social History   Socioeconomic History  . Marital status: Married    Spouse name: Not on file  . Number of children: Not on file  . Years of education: Not on file  . Highest education level: Not on file  Occupational History  . Not on file  Tobacco Use  . Smoking status: Never Smoker  . Smokeless tobacco: Never Used  Vaping Use  . Vaping Use:  Never used  Substance and Sexual Activity  . Alcohol use: Not Currently  . Drug use: Never  . Sexual activity: Not on file  Other Topics Concern  . Not on file  Social History Narrative  . Not on file   Social Determinants of Health   Financial Resource Strain: Not on file  Food Insecurity: Not on file  Transportation Needs: Not on file  Physical Activity: Not on file  Stress: Not on file  Social Connections: Not on file  Intimate Partner Violence: Not on file    Family History:   Denies family history of sudden cardiac death including drowning, car accidents, or unexplained deaths in the family. No other history of bicuspid aortic valve or a history of aortic aneurysm or dissection. (patient has Bisucpid and TAA)  ROS:  Please see the history of present illness.  All other ROS reviewed and negative.     Physical Exam/Data:   Vitals:   03/05/20 0700 03/05/20 0730 03/05/20 0800 03/05/20 0823  BP: (!) 110/57 (!) 108/58 124/63   Pulse: 80 81 86   Resp: 15 16 (!) 21   Temp:    98.3 F (36.8 C)  TempSrc:    Axillary  SpO2: 97% 98% 98%   Weight:      Height:        Intake/Output Summary (Last 24 hours) at 03/05/2020 1045 Last data filed at 03/04/2020 2153 Gross per 24 hour  Intake --  Output 800 ml  Net -800 ml   Last 3 Weights 03/04/2020  Weight (lbs) 140 lb  Weight (kg) 63.504 kg     Body mass index is 20.09 kg/m.  General:  Well nourished, well developed, in no acute distress HEENT: normal Lymph: no adenopathy Neck: no JVD but bounding carotid pulsation noted Endocrine:  No thryomegaly Vascular: No carotid bruits; FA pulses 3+ bilaterally without bruits  Cardiac:  normal S1, S2; RRR; Both a II/VI holodiastolic murmur is present and a II/IV Systolic murmur is present Lungs:  clear to auscultation bilaterally, no wheezing, rhonchi or rales  Abd: soft, nontender, no hepatomegaly  Ext: no edema Musculoskeletal:  No deformities, BUE and BLE strength normal  and equal Skin: warm and dry  Neuro:  CNs 2-12 intact, no focal abnormalities noted Psych:  Normal affect   EKG:  The ECG that was done  was personally reviewed and demonstrates SR 1st degree HB rate 81 RAE, LAE, LAD  Relevant CV Studies:   Echo A-[facility] 03/01/20 PROCEDURE  A complete two-dimensional transthoracic echocardiogram was performed  (2D, M-mode, spectral and color flow Doppler). Image Quality:  Technically adequate.  -  SUMMARY  Left ventricular systolic function is normal.  LV ejection fraction = 60-65%.  There is mild concentric left ventricular hypertrophy.  Stable bioprosthetic aortic valve with a mean pressure gradient of 14  mmHg.  There is mild to moderate aortic regurgitation.  There is mild mitral regurgitation.  There is trace tricuspid regurgitation.  There is no pericardial effusion.  There is no significant change in comparison with the last study.   -  FINDINGS:  LEFT VENTRICLE  The left ventricular size is normal. There is mild concentric left  ventricular hypertrophy. Left ventricular systolic function is normal.  LV ejection fraction = 60-65%. The left ventricular filling pattern is  normal for age. Mitral inflow deceleration timeNormal>150 msec.  Abnormal (paradoxical) septal motion consistent with post-operative  status.   -  RIGHT VENTRICLE  The right ventricle is normal in size and function.   LEFT ATRIUM  The left atrial volume is normal. LA vol index: 24.8 ml/m2. The left  atrial size is normal.   RIGHT ATRIUM  Right atrial size is normal. There is no Doppler evidence for a patent  foramen ovale.  -  AORTIC VALVE  There is a bioprosthetic aortic valve (Hancock Bioprosthesis) in the  aortic position. Stable bioprosthetic aortic valve with a mean  pressure gradient of 14 mmHg. There is mild to moderate aortic  regurgitation. Aortic regurgitation jet is eccentric.  -  MITRAL VALVE  Diffuse thickening of the mitral leaflet with  preserved opening. There  is no mitral stenosis. There is mild mitral regurgitation.  -  TRICUSPID VALVE  Structurally normal tricuspid valve. There is notricuspid stenosis.  There is trace tricuspid  regurgitation. There was insufficient TR  detected to calculate RV systolic pressure.  -  PULMONIC VALVE  The pulmonic valve was not well visualized, but is grossly normal in  structure. There is no pulmonicvalvular stenosis. There is no  significant pulmonic regurgitation.  -  ARTERIES  The aortic sinus is normal size. The ascending aorta is normal size.  -  VENOUS  Pulmonary venous flow pattern is normal. IVC size was normal.  -  EFFUSION  There is no pericardial effusion. There is no pleural effusion.   12/18/19 CMR A-[facility] INTERPRETATION  1. The left ventricular (LV) size is normal with normal LV wall              thickness. The indexed LV end diastolic volume is 79 mL/m2. There is             normal systolic function of the LV with an ejection fraction of 67%.             There is no late gadolinium enhancement of the LV myocardium. Normal             nulling of the blood pool. The T1 values measured around 959 in the             base, which is within normal limits.                             2. The right ventricle (RV) is normal size with normal systolic               function.                                          3. Normal atrial size bilaterally.                              4. No evidence of significant valvular abnormalities.                    5. Normal pericardium with no pericardial effusion.                       Laboratory Data:  High Sensitivity Troponin:   Recent Labs  Lab 03/04/20 1942 03/05/20 0014  TROPONINIHS 116* 113*       Chemistry Recent Labs  Lab 03/04/20 1444 03/04/20 1452 03/05/20 0423  NA 138 137 136  K 4.0 3.9 3.8  CL 104 105 106  CO2 21*  --  23  GLUCOSE 142* 136* 101*  BUN 29* 30* 23  CREATININE 2.01* 1.90* 1.23  CALCIUM 8.3*  --  8.1*  GFRNONAA 34*  --  >60  ANIONGAP 13  --  7    Recent Labs  Lab 03/04/20 1444 03/05/20 0423  PROT 5.2* 4.4*  ALBUMIN 2.6* 2.1*  AST 137* 92*  ALT 95* 68*  ALKPHOS 312* 238*  BILITOT 1.0 0.5   Hematology Recent Labs  Lab 03/04/20 1444 03/04/20 1452 03/05/20 0423  WBC 10.8*  --  7.4  RBC 2.70*  --  2.20*  HGB 9.0* 8.5* 7.6*  HCT 27.3* 25.0* 21.8*  MCV 101.1*  --  99.1  MCH 33.3  --  34.5*  MCHC 33.0  --  34.9  RDW 18.5*  --  18.3*  PLT 176  --  165   BNP Recent Labs  Lab 03/04/20 1444  BNP 440.7*    DDimer No results for input(s): DDIMER in the last 168 hours.  Radiology/Studies:  MR ANGIO HEAD WO CONTRAST  Result Date: 03/04/2020 CLINICAL DATA:  74 year old male code stroke, left-sided weakness. TIA. EXAM: MRA HEAD WITHOUT CONTRAST TECHNIQUE: Angiographic images of the Circle of Willis were obtained using MRA technique without intravenous contrast. COMPARISON:  Brain MRI, neck MRA today. FINDINGS: Antegrade flow in the posterior circulation with mildly dominant distal right vertebral artery. No distal vertebral or vertebrobasilar junction stenosis. Patent basilar artery with no stenosis. Normal AICA, SCA and PCA origins. Posterior communicating arteries are diminutive or absent. Bilateral PCA branches are within normal limits. Antegrade flow in both ICA siphons. Mild siphon tortuosity. No siphon stenosis. Normal ophthalmic artery origins. Patent carotid termini. MCA and ACA origins appear normal. Diminutive or absent anterior communicating artery. Visible bilateral ACA branches are within normal limits. MCA M1 segments, left MCA bifurcation, and right MCA trifurcation are patent without stenosis. Visible bilateral MCA branches are within  normal limits. IMPRESSION: Negative intracranial MRA. Electronically Signed   By: Genevie Ann M.D.   On: 03/04/2020 17:41   MR ANGIO NECK WO CONTRAST  Result Date: 03/04/2020 CLINICAL DATA:  74 year old male code stroke, left-sided weakness. TIA. EXAM: MRA NECK WITHOUT CONTRAST TECHNIQUE: Angiographic images of the neck were obtained using MRA technique without intravenous contrast. Carotid stenosis measurements (when applicable) are obtained utilizing NASCET criteria, using the distal internal carotid diameter as the denominator. COMPARISON:  Brain MRI today reported separately. FINDINGS: Time-of-flight MRA images reveal antegrade flow in the bilateral cervical carotid and vertebral arteries. The level of the aortic arch is included and a 3 vessel arch configuration is noted. Right CCA, right carotid bifurcation, and cervical right ICA are within normal limits. Left CCA, left carotid bifurcation and cervical left ICA are normal aside from tortuosity. Grossly normal proximal subclavian arteries and vertebral artery origins. No evidence of cervical vertebral stenosis, the right vertebral appears slightly dominant. IMPRESSION: Normal for age noncontrast neck MRA. Electronically Signed   By: Genevie Ann M.D.   On: 03/04/2020 17:39   MR BRAIN WO CONTRAST  Result Date: 03/04/2020 CLINICAL DATA:  74 year old male code stroke, left-sided weakness. TIA. EXAM: MRI HEAD WITHOUT CONTRAST TECHNIQUE: Multiplanar, multiecho pulse sequences of the brain and surrounding structures were obtained without intravenous contrast. COMPARISON:  Head CT without contrast 1454 hours today. FINDINGS: Brain: No restricted diffusion to suggest acute infarction. No midline shift, mass effect, evidence of mass lesion, ventriculomegaly, extra-axial collection or acute intracranial hemorrhage. Cervicomedullary junction and pituitary are within normal limits. Suggestion of disproportionate midbrain volume loss on the sagittal image (series 14,  image 13). No cortical encephalomalacia identified, but there are multiple scattered chronic microhemorrhages in the brain, most notably the left thalamus (series 12, image 28). The modest extent does not suggest amyloid angiopathy. Superimposed mild for age nonspecific cerebral white matter signal changes, such as at the posterior external capsule and/or lentiform seen on series 9, image 14. Vascular: Major intracranial vascular flow voids are preserved. Generalized intracranial artery tortuosity. Skull and upper cervical spine: Negative for age visible cervical spine; degenerative ligamentous hypertrophy about the odontoid. Visualized bone marrow signal is within normal limits. Sinuses/Orbits: Postoperative changes to both globes. Paranasal sinuses and mastoids are stable and well pneumatized. Other: Visible internal auditory structures appear normal. Visible scalp and face appear negative. IMPRESSION: 1. No acute  intracranial abnormality. 2. Several chronic microhemorrhages scattered in the brain, most notably at the left thalamus. Mild for age additional signal changes likely due to chronic small vessel disease. 3. Suggestion of disproportionate midbrain volume loss. Electronically Signed   By: Genevie Ann M.D.   On: 03/04/2020 17:36   DG Chest Port 1 View  Result Date: 03/04/2020 CLINICAL DATA:  Shortness of breath. EXAM: PORTABLE CHEST 1 VIEW COMPARISON:  CT chest report 08/15/2011. FINDINGS: Prior median sternotomy and cardiac valve replacement. Thoracic aortic prominence. Thoracic aortic aneurysm was noted by report on prior CT of 08/15/2011. Repeat contrast-enhanced chest CT should be considered for follow-up. Mild bilateral interstitial prominence. This may be chronic. An active interstitial process cannot be completely excluded. No pleural effusion or pneumothorax. Left costophrenic angle incompletely imaged. Degenerative change thoracic spine. IMPRESSION: 1. Prior median sternotomy and cardiac valve  replacement. Thoracic aortic prominence. Thoracic aortic aneurysm was noted by report on prior CT of 08/15/2011. Repeat contrast-enhanced chest CT should be considered for follow-up. 2. Mild bilateral interstitial prominence. This may be chronic. An active interstitial process cannot be completely excluded. Electronically Signed   By: Marcello Moores  Register   On: 03/04/2020 16:15   CT HEAD CODE STROKE WO CONTRAST  Result Date: 03/04/2020 CLINICAL DATA:  Code stroke.  Left-sided weakness. EXAM: CT HEAD WITHOUT CONTRAST TECHNIQUE: Contiguous axial images were obtained from the base of the skull through the vertex without intravenous contrast. COMPARISON:  None. FINDINGS: Brain: Mild atrophy and white matter hypoattenuation present. Basal ganglia are intact. Insular ribbon is normal bilaterally. No acute or cortical abnormality is present. The ventricles are proportionate to the degree of atrophy. Is The brainstem and cerebellum are within normal limits. Vascular: Atherosclerotic calcifications are present within the cavernous internal carotid arteries bilaterally. No hyperdense vessel is present. Skull: Insert normal skull No significant extracranial soft tissue lesion is present. Sinuses/Orbits: The paranasal sinuses and mastoid air cells are clear. Bilateral lens replacements are noted. Globes and orbits are otherwise unremarkable. ASPECTS [facility] Stroke Program Early CT Score) - Ganglionic level infarction (caudate, lentiform nuclei, internal capsule, insula, M1-M3 cortex): 7/7 - Supraganglionic infarction (M4-M6 cortex): 3/3 Total score (0-10 with 10 being normal): 10/10 IMPRESSION: 1. No acute intracranial abnormality. 2. Mild atrophy and white matter disease likely reflects the sequela of chronic microvascular ischemia. 3. Aspects 10/10 The above was relayed via text pager to Dr. Roland Rack on 03/04/2020 at 15:02 . Electronically Signed   By: San Morelle M.D.   On: 03/04/2020 15:03     Assessment and Plan:   Syncope Bicuspid Aortic Valve Thoracic Aortic Aneurysm s/p Repair (2016) Aortic Regurgitation HTN, HLD, non-obstructive CAD - agree with holding antihypertensives; could also have been contributed by anemia - continue telemetry though no evidence of arrhythmia -with risk of structural heart disease  - unlikely coronary in natures, troponin elevation likely in the setting of CPR - There is concern of physical exam and evidence of prosthetic valve failure which could cause syncope; will repeat echocardiogram (ordered)  Prior Malignancy with Cardiotoxic Chemotherapy - CKD Stage IIIa with Amyloidosis - LV Function including GLS or other Strain, 3D Eval, MAPSE, or CMR: LVEF 60% - on Daratumumab without HTN  - Encouraged exercise on a regular basis and healthy dietary habits - if stopping antihypertensives on DC; will need ambulatory BP monitoring  Thoracic aortic aneurysm s/p Repair - Last at 2.8 by echo at OSH - Will get echocardiogram as above - would benefit from CT Aorta as outpatient (  Patient plans to resume cardiac care at [facility] - 1st degree relative one time screening is indicated  Discussed with patient and wife at length  For questions or updates, please contact [place] Please consult [email or web address] for contact info under     Signed, Werner Lean, MD  03/05/2020 10:45 AM

## 2020-03-05 NOTE — ED Notes (Signed)
Lunch tray delivered.

## 2020-03-05 NOTE — Progress Notes (Signed)
Notified of drop in Hgb and consistently low BP. Patient has chronic anemia and I do not expect acute blood loss after talking to patient. No signs of acute blood loss. SBP 120 in room . Maps are greater than 60, BP meds have been held on admission.

## 2020-03-05 NOTE — Progress Notes (Signed)
  Echocardiogram 2D Echocardiogram has been performed.  Antonio Reed 03/05/2020, 3:11 PM

## 2020-03-05 NOTE — Progress Notes (Addendum)
HS-Troponin flat. Patient denies sob and cardiac chest pain. Patient says chest is sore from CPR. No changes to ongoing workup at this time.

## 2020-03-05 NOTE — Progress Notes (Signed)
Both vascular imaging and EEG are negative.   I have a very low concern for primary neurological contribution.  No further work-up or follow-up needed from a neurological perspective unless further symptoms develop.  Roland Rack, MD [facility] [phone]  If 7pm- 7am, please page neurology on call as listed in [place].

## 2020-03-05 NOTE — Progress Notes (Addendum)
EEG complete - results pending 

## 2020-03-05 NOTE — Procedures (Signed)
Patient Name: Antonio Reed  MRN: [id number]  EEG Attending: Roland Rack  Referring Physician/Provider:   Date: 03/05/2020 Duration: 23 minutes  Patient history: 74 year old male being evaluated for loss of consciousness, suspected arrest  Level of alertness: Awake and asleep  AEDs during EEG study: None  Technical aspects: This EEG study was done with scalp electrodes positioned according to the 10-20 International system of electrode placement. Electrical activity was acquired at a sampling rate of 500Hz  and reviewed with a high frequency filter of 70Hz  and a low frequency filter of 1Hz . EEG data were recorded continuously and digitally stored.   BACKGROUND ACTIVITY: Posterior dominant rhythm: The posterior dominant rhythm consists of nine hz activity of moderate voltage (25-35 uV) seen predominantly in posterior head regions, symmetric and reactive to eye opening and eye closing.          Slowing: None  EPILEPTIFORM ACTIVITY: Interictal epileptiform activity: None  Ictal Activity: None  OTHER EVENTS: None  SLEEP RECORDINGS:  There is an anterior shifting of the posterior dominant rhythm and increase in slow activity with drowsiness, sleep is briefly seen with symmetric appearing structures.   ACTIVATION PROCEDURES:  Hyperventilation and photic stimulation were not performed.  IMPRESSION: This study is within normal limits. There was no seizure or evidence of seizure predisposition recorded on this study. Please note that lack of epileptiform activity on EEG does not preclude the possibility of epilepsy.    Roland Rack, MD [facility] [phone]  If 7pm- 7am, please page neurology on call as listed in [place].

## 2020-03-05 NOTE — ED Notes (Signed)
Pt transported to floor from echo.

## 2020-03-06 DIAGNOSIS — I351 Nonrheumatic aortic (valve) insufficiency: Secondary | ICD-10-CM | POA: Diagnosis not present

## 2020-03-06 DIAGNOSIS — I712 Thoracic aortic aneurysm, without rupture: Secondary | ICD-10-CM | POA: Diagnosis not present

## 2020-03-06 DIAGNOSIS — R55 Syncope and collapse: Secondary | ICD-10-CM | POA: Diagnosis not present

## 2020-03-06 LAB — CBC
HCT: 24.9 % — ABNORMAL LOW (ref 39.0–52.0)
Hemoglobin: 8.4 g/dL — ABNORMAL LOW (ref 13.0–17.0)
MCH: 33.3 pg (ref 26.0–34.0)
MCHC: 33.7 g/dL (ref 30.0–36.0)
MCV: 98.8 fL (ref 80.0–100.0)
Platelets: 180 10*3/uL (ref 150–400)
RBC: 2.52 MIL/uL — ABNORMAL LOW (ref 4.22–5.81)
RDW: 18.5 % — ABNORMAL HIGH (ref 11.5–15.5)
WBC: 7.1 10*3/uL (ref 4.0–10.5)
nRBC: 0.8 % — ABNORMAL HIGH (ref 0.0–0.2)

## 2020-03-06 LAB — BASIC METABOLIC PANEL
Anion gap: 8 (ref 5–15)
BUN: 15 mg/dL (ref 8–23)
CO2: 24 mmol/L (ref 22–32)
Calcium: 8.4 mg/dL — ABNORMAL LOW (ref 8.9–10.3)
Chloride: 107 mmol/L (ref 98–111)
Creatinine, Ser: 1.05 mg/dL (ref 0.61–1.24)
GFR, Estimated: >60 mL/min (ref >60)
Glucose, Bld: 104 mg/dL — ABNORMAL HIGH (ref 70–99)
Potassium: 3.9 mmol/L (ref 3.5–5.1)
Sodium: 139 mmol/L (ref 135–145)

## 2020-03-06 LAB — TYPE AND SCREEN
ABO/RH(D): A POS
Antibody Screen: POSITIVE

## 2020-03-06 LAB — URINE CULTURE: Culture: <10000 — AB

## 2020-03-06 NOTE — Plan of Care (Signed)
  Problem: Education: Goal: Ability to demonstrate management of disease process will improve Outcome: Progressing   Problem: Education: Goal: Ability to verbalize understanding of medication therapies will improve Outcome: Progressing   Problem: Education: Goal: Individualized Educational Video(s) Outcome: Progressing

## 2020-03-06 NOTE — Progress Notes (Signed)
Progress Note  Patient Name: Antonio Reed Date of Encounter: 03/06/2020  Primary Cardiologist: No primary care provider on file.   Subjective   Antonio Reed is a 74 y.o. 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 Echo 03/06/20 shows at least moderate eccentric aortic regurgitation without LV dilation or holodiastolic flow reversal.  Patient notes no sx; no SOB, PND, orthopnea or syncope.  Inpatient Medications    Scheduled Meds: . acyclovir  400 mg Oral BID  . allopurinol  200 mg Oral Daily  . aspirin EC  81 mg Oral Daily  . diphenhydrAMINE  25 mg Oral QHS  . heparin  5,000 Units Subcutaneous Q8H  . lidocaine  1 patch Transdermal Q24H  . multivitamin with minerals  1 tablet Oral Daily  . pantoprazole  40 mg Oral Daily  . rosuvastatin  20 mg Oral QHS  . sodium chloride flush  3 mL Intravenous Q12H   Continuous Infusions: . sodium chloride     PRN Meds: sodium chloride, acetaminophen **OR** acetaminophen, polyethylene glycol, sodium chloride flush   Vital Signs    Vitals:   03/05/20 2037 03/05/20 2348 03/06/20 0003 03/06/20 0511  BP: 113/64 125/67  114/69  Pulse: 98 97  86  Resp: 18 18  18   Temp: 99.3 F (37.4 C) 98.1 F (36.7 C)  98 F (36.7 C)  TempSrc: Oral Oral  Oral  SpO2:  98%  99%  Weight:   63.5 kg   Height:        Intake/Output Summary (Last 24 hours) at 03/06/2020 1137 Last data filed at 03/06/2020 0900 Gross per 24 hour  Intake 720 ml  Output 2125 ml  Net -1405 ml   Filed Weights   03/04/20 1521 03/05/20 1526 03/06/20 0003  Weight: 63.5 kg 64.4 kg 63.5 kg    Telemetry    SR and Sinus  tachycardia - Personally Reviewed  ECG    No new - Personally Reviewed  Physical Exam   GEN: No acute distress.   Neck: No JVD, rapid carotid pulse noted Cardiac: RRR, II/VI diastolic murmur, II/VI systolic murmur no rubs, or gallops.  Respiratory: Clear to auscultation bilaterally. GI: Soft, nontender, non-distended  MS: No edema; No deformity. Neuro:  Nonfocal  Psych: Normal affect   Labs    Chemistry Recent Labs  Lab 03/04/20 1444 03/04/20 1452 03/05/20 0423 03/06/20 0453  NA 138 137 136 139  K 4.0 3.9 3.8 3.9  CL 104 105 106 107  CO2 21*  --  23 24  GLUCOSE 142* 136* 101* 104*  BUN 29* 30* 23 15  CREATININE 2.01* 1.90* 1.23 1.05  CALCIUM 8.3*  --  8.1* 8.4*  PROT 5.2*  --  4.4*  --   ALBUMIN 2.6*  --  2.1*  --   AST 137*  --  92*  --   ALT 95*  --  68*  --   ALKPHOS 312*  --  238*  --   BILITOT 1.0  --  0.5  --   GFRNONAA 34*  --  >60 >60  ANIONGAP 13  --  7 8     Hematology Recent Labs  Lab 03/05/20 0423 03/05/20 1200 03/06/20  0453  WBC 7.4 8.9 7.1  RBC 2.20* 2.46* 2.52*  HGB 7.6* 8.2* 8.4*  HCT 21.8* 24.5* 24.9*  MCV 99.1 99.6 98.8  MCH 34.5* 33.3 33.3  MCHC 34.9 33.5 33.7  RDW 18.3* 18.1* 18.5*  PLT 165 191 180    Cardiac EnzymesNo results for input(s): TROPONINI in the last 168 hours. No results for input(s): TROPIPOC in the last 168 hours.   BNP Recent Labs  Lab 03/04/20 1444  BNP 440.7*     DDimer No results for input(s): DDIMER in the last 168 hours.   Radiology    MR ANGIO HEAD WO CONTRAST  Result Date: 03/04/2020 CLINICAL DATA:  74 year old male code stroke, left-sided weakness. TIA. EXAM: MRA HEAD WITHOUT CONTRAST TECHNIQUE: Angiographic images of the Circle of Willis were obtained using MRA technique without intravenous contrast. COMPARISON:  Brain MRI, neck MRA today. FINDINGS: Antegrade flow in the posterior circulation with mildly dominant distal right vertebral artery. No distal vertebral or vertebrobasilar junction  stenosis. Patent basilar artery with no stenosis. Normal AICA, SCA and PCA origins. Posterior communicating arteries are diminutive or absent. Bilateral PCA branches are within normal limits. Antegrade flow in both ICA siphons. Mild siphon tortuosity. No siphon stenosis. Normal ophthalmic artery origins. Patent carotid termini. MCA and ACA origins appear normal. Diminutive or absent anterior communicating artery. Visible bilateral ACA branches are within normal limits. MCA M1 segments, left MCA bifurcation, and right MCA trifurcation are patent without stenosis. Visible bilateral MCA branches are within normal limits. IMPRESSION: Negative intracranial MRA. Electronically Signed   By: Genevie Ann M.D.   On: 03/04/2020 17:41   MR ANGIO NECK WO CONTRAST  Result Date: 03/04/2020 CLINICAL DATA:  74 year old male code stroke, left-sided weakness. TIA. EXAM: MRA NECK WITHOUT CONTRAST TECHNIQUE: Angiographic images of the neck were obtained using MRA technique without intravenous contrast. Carotid stenosis measurements (when applicable) are obtained utilizing NASCET criteria, using the distal internal carotid diameter as the denominator. COMPARISON:  Brain MRI today reported separately. FINDINGS: Time-of-flight MRA images reveal antegrade flow in the bilateral cervical carotid and vertebral arteries. The level of the aortic arch is included and a 3 vessel arch configuration is noted. Right CCA, right carotid bifurcation, and cervical right ICA are within normal limits. Left CCA, left carotid bifurcation and cervical left ICA are normal aside from tortuosity. Grossly normal proximal subclavian arteries and vertebral artery origins. No evidence of cervical vertebral stenosis, the right vertebral appears slightly dominant. IMPRESSION: Normal for age noncontrast neck MRA. Electronically Signed   By: Genevie Ann M.D.   On: 03/04/2020 17:39   MR BRAIN WO CONTRAST  Result Date: 03/04/2020 CLINICAL DATA:  74 year old male code  stroke, left-sided weakness. TIA. EXAM: MRI HEAD WITHOUT CONTRAST TECHNIQUE: Multiplanar, multiecho pulse sequences of the brain and surrounding structures were obtained without intravenous contrast. COMPARISON:  Head CT without contrast 1454 hours today. FINDINGS: Brain: No restricted diffusion to suggest acute infarction. No midline shift, mass effect, evidence of mass lesion, ventriculomegaly, extra-axial collection or acute intracranial hemorrhage. Cervicomedullary junction and pituitary are within normal limits. Suggestion of disproportionate midbrain volume loss on the sagittal image (series 14, image 13). No cortical encephalomalacia identified, but there are multiple scattered chronic microhemorrhages in the brain, most notably the left thalamus (series 12, image 28). The modest extent does not suggest amyloid angiopathy. Superimposed mild for age nonspecific cerebral white matter signal changes, such as at the posterior external capsule and/or lentiform seen on series 9, image 14. Vascular: Major intracranial  vascular flow voids are preserved. Generalized intracranial artery tortuosity. Skull and upper cervical spine: Negative for age visible cervical spine; degenerative ligamentous hypertrophy about the odontoid. Visualized bone marrow signal is within normal limits. Sinuses/Orbits: Postoperative changes to both globes. Paranasal sinuses and mastoids are stable and well pneumatized. Other: Visible internal auditory structures appear normal. Visible scalp and face appear negative. IMPRESSION: 1. No acute intracranial abnormality. 2. Several chronic microhemorrhages scattered in the brain, most notably at the left thalamus. Mild for age additional signal changes likely due to chronic small vessel disease. 3. Suggestion of disproportionate midbrain volume loss. Electronically Signed   By: Genevie Ann M.D.   On: 03/04/2020 17:36   DG Chest Port 1 View  Result Date: 03/04/2020 CLINICAL DATA:  Shortness of  breath. EXAM: PORTABLE CHEST 1 VIEW COMPARISON:  CT chest report 08/15/2011. FINDINGS: Prior median sternotomy and cardiac valve replacement. Thoracic aortic prominence. Thoracic aortic aneurysm was noted by report on prior CT of 08/15/2011. Repeat contrast-enhanced chest CT should be considered for follow-up. Mild bilateral interstitial prominence. This may be chronic. An active interstitial process cannot be completely excluded. No pleural effusion or pneumothorax. Left costophrenic angle incompletely imaged. Degenerative change thoracic spine. IMPRESSION: 1. Prior median sternotomy and cardiac valve replacement. Thoracic aortic prominence. Thoracic aortic aneurysm was noted by report on prior CT of 08/15/2011. Repeat contrast-enhanced chest CT should be considered for follow-up. 2. Mild bilateral interstitial prominence. This may be chronic. An active interstitial process cannot be completely excluded. Electronically Signed   By: Marcello Moores  Register   On: 03/04/2020 16:15   EEG adult  Result Date: 03/05/2020 Greta Doom, MD     03/05/2020 12:53 PM Patient Name: Kreston Ahrendt MRN: [id number] EEG Attending: Roland Rack Referring Physician/Provider:  Date: 03/05/2020 Duration: 23 minutes Patient history: 74 year old male being evaluated for loss of consciousness, suspected arrest Level of alertness: Awake and asleep AEDs during EEG study: None Technical aspects: This EEG study was done with scalp electrodes positioned according to the 10-20 International system of electrode placement. Electrical activity was acquired at a sampling rate of 500Hz  and reviewed with a high frequency filter of 70Hz  and a low frequency filter of 1Hz . EEG data were recorded continuously and digitally stored. BACKGROUND ACTIVITY: Posterior dominant rhythm: The posterior dominant rhythm consists of nine hz activity of moderate voltage (25-35 uV) seen predominantly in posterior head regions, symmetric and reactive to eye  opening and eye closing.        Slowing: None EPILEPTIFORM ACTIVITY: Interictal epileptiform activity: None Ictal Activity: None OTHER EVENTS: None SLEEP RECORDINGS: There is an anterior shifting of the posterior dominant rhythm and increase in slow activity with drowsiness, sleep is briefly seen with symmetric appearing structures. ACTIVATION PROCEDURES: Hyperventilation and photic stimulation were not performed. IMPRESSION: This study is within normal limits. There was no seizure or evidence of seizure predisposition recorded on this study. Please note that lack of epileptiform activity on EEG does not preclude the possibility of epilepsy.  Roland Rack, MD [facility] [phone] If 7pm- 7am, please page neurology on call as listed in [place].   ECHOCARDIOGRAM COMPLETE  Result Date: 03/05/2020    ECHOCARDIOGRAM REPORT   Patient Name:   Garey Reierson Date of Exam: 03/05/2020 Medical Rec #:  [id number]    Height:       70.0 in Accession #:    [id number]   Weight:       140.0 lb Date of Birth:  09/07/1945     BSA:  1.794 m Patient Age:    65 years     BP:           124/58 mmHg Patient Gender: M            HR:           91 bpm. Exam Location:  Inpatient Procedure: 2D Echo, Cardiac Doppler and Color Doppler Indications:    Aortic regurgitation  History:        Patient has no prior history of Echocardiogram examinations.                 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 Chemo, Amyloidosis, anemia.  Sonographer:    Dustin Flock Referring Phys: [id number] [place] A Marzell Allemand IMPRESSIONS  1. Aortic regurgitation is very eccentric, best appreciated in A5c view. There is no rocking suggestive of valve dehiscence. There is no holodiastolic flow reversal noted. AT < 100 ms, DVI 0.4; suggesting against prosthetic valve stenosis. Cannot exclude peri-valvular leak. The  aortic valve has been repaired/replaced. Aortic valve regurgitation is at least moderate. Procedure Date: 2016.  2. Left ventricular ejection fraction, by estimation, is 65 to 70%. The left ventricle has normal function. The left ventricle has no regional wall motion abnormalities. Left ventricular diastolic parameters are consistent with Grade I diastolic dysfunction (impaired relaxation).  3. Right ventricular systolic function is normal. The right ventricular size is normal.  4. The mitral valve is grossly normal. Trivial mitral valve regurgitation. Comparison(s): No prior Echocardiogram. Conclusion(s)/Recommendation(s): Concern that study underappreciates degree of aortic regurgitation. Consulting cardiologist aware. FINDINGS  Left Ventricle: Left ventricular ejection fraction, by estimation, is 65 to 70%. The left ventricle has normal function. The left ventricle has no regional wall motion abnormalities. The left ventricular internal cavity size was normal in size. There is  borderline concentric left ventricular hypertrophy. Left ventricular diastolic parameters are consistent with Grade I diastolic dysfunction (impaired relaxation). Right Ventricle: The right ventricular size is normal. No increase in right ventricular wall thickness. Right ventricular systolic function is normal. Left Atrium: Left atrial size was normal in size. Right Atrium: Right atrial size was normal in size. Pericardium: There is no evidence of pericardial effusion. Mitral Valve: The mitral valve is grossly normal. Trivial mitral valve regurgitation. Tricuspid Valve: The tricuspid valve is normal in structure. Tricuspid valve regurgitation is not demonstrated. Aortic Valve: Aortic regurgitation is very eccentric, best appreciated in A5c view. There is no rocking suggestive of valve dehiscence. There is no holodiastolic flow reversal noted. AT < 100 ms, DVI 0.4; suggesting against prosthetic valve stenosis. Cannot exclude peri-valvular  leak. The aortic valve has been repaired/replaced. Aortic valve regurgitation is moderate. Aortic regurgitation PHT measures 410 msec. Aortic valve mean gradient measures 19.0 mmHg. Aortic valve peak gradient measures 35.4 mmHg. Aortic valve area, by VTI measures 1.56 cm. There is a Hancock valve present in the aortic position. Pulmonic Valve: The pulmonic valve was not well visualized. Pulmonic valve regurgitation is not visualized. Aorta: The aortic root is normal in size and structure and the ascending aorta was not well visualized. IAS/Shunts: The atrial septum is grossly normal.  LEFT VENTRICLE PLAX 2D LVIDd:         4.50 cm  Diastology LVIDs:  2.80 cm  LV e' medial:    9.01 cm/s LV PW:         1.20 cm  LV E/e' medial:  8.0 LV IVS:        1.00 cm  LV e' lateral:   14.00 cm/s LVOT diam:     2.20 cm  LV E/e' lateral: 5.2 LV SV:         73 LV SV Index:   41 LVOT Area:     3.80 cm  RIGHT VENTRICLE RV Basal diam:  3.30 cm RV S prime:     12.00 cm/s TAPSE (M-mode): 2.5 cm LEFT ATRIUM             Index       RIGHT ATRIUM           Index LA diam:        3.20 cm 1.78 cm/m  RA Area:     14.50 cm LA Vol (A2C):   49.8 ml 27.76 ml/m RA Volume:   36.30 ml  20.24 ml/m LA Vol (A4C):   54.4 ml 30.33 ml/m LA Biplane Vol: 55.1 ml 30.72 ml/m  AORTIC VALVE AV Area (Vmax):    1.38 cm AV Area (Vmean):   1.34 cm AV Area (VTI):     1.56 cm AV Vmax:           297.67 cm/s AV Vmean:          208.000 cm/s AV VTI:            0.468 m AV Peak Grad:      35.4 mmHg AV Mean Grad:      19.0 mmHg LVOT Vmax:         108.00 cm/s LVOT Vmean:        73.100 cm/s LVOT VTI:          0.192 m LVOT/AV VTI ratio: 0.41 AI PHT:            410 msec  AORTA Ao Root diam: 2.70 cm MITRAL VALVE MV Area (PHT): 4.15 cm    SHUNTS MV Decel Time: 183 msec    Systemic VTI:  0.19 m MV E velocity: 72.40 cm/s  Systemic Diam: 2.20 cm MV A velocity: 83.60 cm/s MV E/A ratio:  0.87 Rudean Haskell MD Electronically signed by Rudean Haskell MD  Signature Date/Time: 03/05/2020/4:00:01 PM    Final    CT HEAD CODE STROKE WO CONTRAST  Result Date: 03/04/2020 CLINICAL DATA:  Code stroke.  Left-sided weakness. EXAM: CT HEAD WITHOUT CONTRAST TECHNIQUE: Contiguous axial images were obtained from the base of the skull through the vertex without intravenous contrast. COMPARISON:  None. FINDINGS: Brain: Mild atrophy and white matter hypoattenuation present. Basal ganglia are intact. Insular ribbon is normal bilaterally. No acute or cortical abnormality is present. The ventricles are proportionate to the degree of atrophy. Is The brainstem and cerebellum are within normal limits. Vascular: Atherosclerotic calcifications are present within the cavernous internal carotid arteries bilaterally. No hyperdense vessel is present. Skull: Insert normal skull No significant extracranial soft tissue lesion is present. Sinuses/Orbits: The paranasal sinuses and mastoid air cells are clear. Bilateral lens replacements are noted. Globes and orbits are otherwise unremarkable. ASPECTS [facility] Stroke Program Early CT Score) - Ganglionic level infarction (caudate, lentiform nuclei, internal capsule, insula, M1-M3 cortex): 7/7 - Supraganglionic infarction (M4-M6 cortex): 3/3 Total score (0-10 with 10 being normal): 10/10 IMPRESSION: 1. No acute intracranial abnormality. 2. Mild atrophy and white matter disease likely reflects  the sequela of chronic microvascular ischemia. 3. Aspects 10/10 The above was relayed via text pager to Dr. Roland Rack on 03/04/2020 at 15:02 . Electronically Signed   By: San Morelle M.D.   On: 03/04/2020 15:03    Cardiac Studies   Concern for prosthetic valve aortic insufficiency as above  Patient Profile     74 y.o. male syncope  Assessment & Plan    Syncope Bicuspid Aortic Valve Thoracic Aortic Aneurysm s/p Repair (2016) At Least Moderate Eccentric Aortic Regurgitation HTN, HLD, non-obstructive CAD - holding  antihypertensive regimen -with risk of structural heart disease  - discussed at length concern for valve dysfunction; patient and wife would prefer to get further evaluation with primary cardiologist; which is reasonable  Prior Malignancy with Cardiotoxic Chemotherapy - CKD Stage IIIa with Amyloidosis - LV Function including GLS or other Strain, 3D Eval, MAPSE, or CMR: LVEF 60% - on Daratumumab without HTN  - Encouraged exercise on a regular basis and healthy dietary habits - will need ambulatory BP monitoring - present chemo-regimen described likely will not cause hypotension and would be reasonable to go to appointment 03/07/20 if asymptomatic and approved by patients' oncologist  Thoracic aortic aneurysm s/p Repair - Last at 2.7 - would benefit from CT Aorta as outpatient (Patient plans to resume cardiac care at [facility] - 1st degree relative one time screening is indicated  Time Spent Directly with Patient:   I have spent a total of  35 minutes with the patient reviewing hospital notes, telemetry, EKGs, labs and examining the patient as well as establishing an assessment and plan that was discussed personally with the patient.  > 50% of time was spent in direct patient care discussing with patient and wife.   For questions or updates, please contact [facility] Please consult [email or web address] for contact info under Cardiology/STEMI.      Signed, Werner Lean, MD  03/06/2020, 11:37 AM

## 2020-03-06 NOTE — Progress Notes (Signed)
Subjective:   Antonio Reed states he feels well this morning. He denies any acute symptoms, including chest pain, dizziness.   We discussed plan to hold anti-hypertensives with daily BP monitoring at home until he can follow up with his OP cardiologist or PCP, whoever can fit him in for an appointment sooner. He is in agreement with this plan. We also discussed his low blood counts and that they may continued to decline with chemotherapy.   Objective:  Vital signs in last 24 hours: Vitals:   03/05/20 2037 03/05/20 2348 03/06/20 0003 03/06/20 0511  BP: 113/64 125/67  114/69  Pulse: 98 97  86  Resp: 18 18  18   Temp: 99.3 F (37.4 C) 98.1 F (36.7 C)  98 F (36.7 C)  TempSrc: Oral Oral  Oral  SpO2:  98%  99%  Weight:   63.5 kg   Height:       Physical Exam Vitals and nursing note reviewed.  Constitutional:      General: He is not in acute distress.    Appearance: He is normal weight.  Cardiovascular:     Rate and Rhythm: Normal rate and regular rhythm.     Heart sounds: Murmur heard.   Decrescendo systolic murmur is present with a grade of 3/6.   Pulmonary:     Effort: Pulmonary effort is normal. No respiratory distress.     Breath sounds: No wheezing or rales.  Musculoskeletal:     Right lower leg: No edema.     Left lower leg: No edema.  Skin:    General: Skin is warm and dry.  Neurological:     General: No focal deficit present.     Mental Status: He is alert and oriented to person, place, and time. Mental status is at baseline.  Psychiatric:        Mood and Affect: Mood normal.        Behavior: Behavior normal.    Telemetry: Sinus rhythm with prolonged PR interval, no dropped beats.   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   #Transient Loss of consciousness Orthostatics taken today and shows elevated HR but no hypotension. EKG and telemetry is reassuring.His BP has been at goal without Losartan or Metoprolol, so plan to hold on discharge. Patient has a BP cuff at home and is willing to take daily readings.   Cardiology consulted yesterday due to hx of TAVR with now mild-mod AR. Repeat TTE showed findings consistent with his echo a few day ago; no new valvular dysfunction or ventricular failure. No evidence of valve dehiscence or stenosis. Will follow up with Cards to see if TEE is recommended.   - Cardiology and Neurology following; appreciate their recommendations  - Telemetry to monitor for arrhythmias  - Lidocaine patch  # Hypotension # Hx of Hypertension As noted above, will hold home medications on discharge until patient develops hypertension prior to discharge, though unlikely. BP cuff at home and counseled to take daily readings.   - Hold home losartan and metoprolol  # Chronic Macrocytic Anemia  I suspect patient's baseline hemoglobin will continue to trend down with continued chemotherapy. No indication he is having an acute bleed. Folate, ferritin and B12 WNL.   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 Bone marrow +. Received cyclophosphamide, bortezomib and dexamethasone and currently on dara +CyBorD.  He remains on prophylactic acyclovir. Renal function stable while admitted.   Prior to Admission Living Arrangement: Home Anticipated Discharge Location:Home Barriers to Discharge: ongoing management Dispo: Anticipated discharge in approximately 0-1 day(s).   Dr. Jose Persia Internal Medicine PGY-2  Pager: [phone] After 5pm on  weekdays and 1pm on weekends: On Call pager [phone]  03/06/2020, 8:36 AM

## 2020-03-06 NOTE — Progress Notes (Signed)
Name: Antonio Reed MRN: [id number] DOB: December 07, 1945 74 y.o. PCP: Patient, No Pcp Per  Date of Admission: 03/04/2020  2:35 PM Date of Discharge: 03/06/2020 Attending Physician: Dr.Hoffman Discharge Diagnosis: 1. Transient loss of consciousness 2.Hypotension   Discharge Medications: Allergies as of 03/06/2020      Reactions   Atorvastatin Other (See Comments)   Muscle cramps   Contrast Media [iodinated Diagnostic Agents] Rash      Medication List    STOP taking these medications   losartan 100 MG tablet Commonly known as: COZAAR   metoprolol tartrate 25 MG tablet Commonly known as: LOPRESSOR     TAKE these medications   acyclovir 400 MG tablet Commonly known as: ZOVIRAX Take 400 mg by mouth 2 (two) times daily.   allopurinol 100 MG tablet Commonly known as: ZYLOPRIM Take 200 mg by mouth daily.   aspirin EC 81 MG tablet Take 81 mg by mouth daily. Swallow whole.   Centrum Silver 50+Men Tabs Take 1 tablet by mouth daily.   Cholecalciferol 125 MCG (5000 UT) Tabs Take 5,000 Units by mouth daily.   cyclophosphamide 50 MG capsule Commonly known as: CYTOXAN Take 550 mg by mouth once a week. Three weeks on and 1 week off.   dexamethasone 2 MG tablet Commonly known as: DECADRON Take 10 mg by mouth once a week. On Monday after cytoxan   diphenhydrAMINE 25 MG tablet Commonly known as: SOMINEX Take 25 mg by mouth at bedtime. For sleep   doxycycline 100 MG tablet Commonly known as: VIBRA-TABS Take 100 mg by mouth 2 (two) times daily as needed. For stye treatment pertaining to chemo treatment   Fish Oil 1000 MG Caps Take 1,000 mg by mouth daily.   fluorouracil 5 % cream Commonly known as: EFUDEX Apply 1 application topically daily as needed. Cancer spots   furosemide 20 MG tablet Commonly known as: LASIX Take 20 mg by mouth 2 (two) times daily.   omeprazole 40 MG capsule Commonly known as: PRILOSEC Take 40 mg by mouth daily.   ondansetron 8 MG  tablet Commonly known as: ZOFRAN Take 8 mg by mouth 3 (three) times daily as needed for nausea (before chemo treatment).   pimecrolimus 1 % cream Commonly known as: ELIDEL Apply 1 application topically daily.   rosuvastatin 20 MG tablet Commonly known as: CRESTOR Take 20 mg by mouth at bedtime.       Disposition and follow-up:   [name] was discharged from [facility] in Stable condition.  At the hospital follow up visit please address:  1.  A. Transient loss of consciousness: Resolved, no further follow up at this time. B.Hypotension: Resolved, holding home anti-hypertensive and advised to monitor BP daily and see PCP in a week.   2.  Labs / imaging needed at time of follow-up: None  3.  Pending labs/ test needing follow-up: Daily BP monitoring  Follow-up Appointments:  Follow-up Information    Pearson, Bethena Roys, MD Follow up.   Specialty: Internal Medicine Why: this is patients PCP Contact information: [facility] [address] [place] [address] [place] Hospital Course by problem list: 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    Transient Loss of consciousness  Orthostatics taken and shows elevated HR but no hypotension. EKG and telemetry is reassuring. His BP has been at goal without Losartan or Metoprolol, so plan to hold on discharge. Patient has a BP cuff at home and is willing to take daily readings.  Cardiology consulted  due to hx of TAVR with now mild-mod AR. Repeat TTE showed findings consistent with his echo a few day ago; no new valvular dysfunction or ventricular failure. No evidence of valve dehiscence or stenosis.    Hypotension # Hx of Hypertension As noted above, will hold  home medications on discharge until patient develops hypertension prior to discharge, though unlikely. BP cuff at home and counseled to take daily readings.  Hold home losartan and metoprolol   Chronic Macrocytic Anemia  Suspect patient's baseline hemoglobin will continue to trend down with continued chemotherapy. No indication he is having an acute bleed. Folate, ferritin and B12 WNL. Continued to monitor while admitted. 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 Bone marrow +. Received cyclophosphamide, bortezomib and dexamethasone and currently on dara +CyBorD.  He remains on prophylactic acyclovir. Renal function stable while admitted.   Discharge Vitals:   BP (!) 122/59 (BP Location: Left Arm)   Pulse (!) 101   Temp 98.7 F (37.1 C) (Oral)   Resp 20   Ht 5\' 10"  (1.778 m)   Wt 63.5 kg Comment: scale b  SpO2 99%   BMI 20.09 kg/m   Pertinent Labs, Studies, and Procedures:  CBC Latest Ref Rng & Units 03/06/2020 03/05/2020 03/05/2020  WBC 4.0 - 10.5 K/uL 7.1 8.9 7.4  Hemoglobin 13.0 - 17.0 g/dL 8.4(L) 8.2(L) 7.6(L)  Hematocrit 39.0 - 52.0 % 24.9(L) 24.5(L) 21.8(L)  Platelets 150 - 400 K/uL 180 191 165   BMP Latest Ref Rng & Units 03/06/2020 03/05/2020 03/04/2020  Glucose 70 - 99 mg/dL 104(H) 101(H) 136(H)  BUN 8 - 23 mg/dL 15 23 30(H)  Creatinine 0.61 - 1.24 mg/dL 1.05 1.23 1.90(H)  Sodium 135 - 145 mmol/L 139 136 137  Potassium 3.5 - 5.1 mmol/L 3.9 3.8 3.9  Chloride 98 - 111 mmol/L 107 106 105  CO2 22 - 32 mmol/L 24 23 -  Calcium 8.9 - 10.3 mg/dL 8.4(L) 8.1(L) -   CT head IMPRESSION: 1. No acute intracranial abnormality. 2. Mild atrophy and white matter disease likely reflects the sequela of chronic microvascular ischemia. 3. Aspects 10/10  Chest x-ray IMPRESSION: 1. Prior median sternotomy and cardiac valve  replacement. Thoracic aortic prominence. Thoracic aortic aneurysm was noted by report on prior CT of 08/15/2011. Repeat contrast-enhanced chest CT should be considered for follow-up. 2. Mild bilateral interstitial prominence. This may be chronic. An active interstitial process cannot be completely excluded.  MRI head   IMPRESSION: 1. No acute intracranial abnormality. 2. Several chronic microhemorrhages scattered in the brain, most notably at the left thalamus. Mild for age additional signal changes likely due to chronic small vessel disease. 3. Suggestion of disproportionate midbrain volume loss.  MRA  IMPRESSION: Negative intracranial MRA. Discharge Instructions: Discharge Instructions    Call MD for:  difficulty breathing, headache or visual disturbances   Complete by: As directed    Call MD for:  extreme fatigue   Complete by: As directed    Call MD for:  hives   Complete by: As directed    Call MD for:  persistant dizziness or light-headedness  Complete by: As directed    Call MD for:  persistant nausea and vomiting   Complete by: As directed    Call MD for:  severe uncontrolled pain   Complete by: As directed    Call MD for:  temperature >100.4   Complete by: As directed    Discharge instructions   Complete by: As directed    Mr. Antonio, Reed were admitted to the hospital after an event that caused you to lose consciousness. Work up at the hospital included multiple scans of your head and neck area that ruled out stroke. An EEG was done to rule out seizure. Cardiology came by as well and check a heart ultrasound that showed no new changes to how your valve is working. Your kidney function, liver function and electrolytes were all stable.   Due to this, it is very possible your blood pressure may have dropped too low. We would recommend you stop your Losartan and Metoprolol. Take your blood pressure daily at home and keep a log. Follow up with your heart doctor or  primary care doctor in the next 1 week. If your blood pressure goes above 140/90 at home, call your doctor for additional recommendations.    It was a pleasure meeting you and your wife, and we wish you the best!   - Dr. Charleen Kirks (Dr. B)   Increase activity slowly   Complete by: As directed       Signed: Dr. Jose Persia Internal Medicine PGY-2  Pager: [phone] After 5pm on weekdays and 1pm on weekends: On Call pager [phone]  03/11/2020, 11:52 AM

## 2020-03-09 LAB — CULTURE, BLOOD (ROUTINE X 2)
Culture: NO GROWTH
Culture: NO GROWTH

## 2020-03-11 NOTE — Hospital Course (Signed)
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    #Transient Loss of consciousness Orthostatics taken today and shows elevated HR but no hypotension. EKG and telemetry is reassuring. His BP has been at goal without Losartan or Metoprolol, so plan to hold on discharge. Patient has a BP cuff at home and is willing to take daily readings.    Cardiology consulted yesterday due to hx of TAVR with now mild-mod AR. Repeat TTE showed findings consistent with his echo a few day ago; no new valvular dysfunction or ventricular failure. No evidence of valve dehiscence or stenosis. Will follow up with Cards to see if TEE is recommended.    - Cardiology and Neurology following; appreciate their recommendations  - Telemetry to monitor for arrhythmias  - Lidocaine patch   # Hypotension # Hx of Hypertension As noted above, will hold home medications on discharge until patient develops hypertension prior to discharge, though unlikely. BP cuff at home and counseled to take daily readings.    - Hold home losartan and metoprolol   # Chronic Macrocytic Anemia  I suspect patient's baseline hemoglobin will continue to trend down with continued chemotherapy. No indication he is having an acute bleed. Folate, ferritin and B12 WNL.    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 Bone marrow +. Received cyclophosphamide, bortezomib and dexamethasone and currently on dara +CyBorD.  He remains on prophylactic acyclovir. Renal function stable while admitted.

## 2020-03-16 NOTE — Discharge Summary (Signed)
Name: Antonio Reed MRN: [id number] DOB: December 07, 1945 74 y.o. PCP: Patient, No Pcp Per  Date of Admission: 03/04/2020  2:35 PM Date of Discharge: 03/06/2020 Attending Physician: Dr.Hoffman Discharge Diagnosis: 1. Transient loss of consciousness 2.Hypotension   Discharge Medications: Allergies as of 03/06/2020      Reactions   Atorvastatin Other (See Comments)   Muscle cramps   Contrast Media [iodinated Diagnostic Agents] Rash      Medication List    STOP taking these medications   losartan 100 MG tablet Commonly known as: COZAAR   metoprolol tartrate 25 MG tablet Commonly known as: LOPRESSOR     TAKE these medications   acyclovir 400 MG tablet Commonly known as: ZOVIRAX Take 400 mg by mouth 2 (two) times daily.   allopurinol 100 MG tablet Commonly known as: ZYLOPRIM Take 200 mg by mouth daily.   aspirin EC 81 MG tablet Take 81 mg by mouth daily. Swallow whole.   Centrum Silver 50+Men Tabs Take 1 tablet by mouth daily.   Cholecalciferol 125 MCG (5000 UT) Tabs Take 5,000 Units by mouth daily.   cyclophosphamide 50 MG capsule Commonly known as: CYTOXAN Take 550 mg by mouth once a week. Three weeks on and 1 week off.   dexamethasone 2 MG tablet Commonly known as: DECADRON Take 10 mg by mouth once a week. On Monday after cytoxan   diphenhydrAMINE 25 MG tablet Commonly known as: SOMINEX Take 25 mg by mouth at bedtime. For sleep   doxycycline 100 MG tablet Commonly known as: VIBRA-TABS Take 100 mg by mouth 2 (two) times daily as needed. For stye treatment pertaining to chemo treatment   Fish Oil 1000 MG Caps Take 1,000 mg by mouth daily.   fluorouracil 5 % cream Commonly known as: EFUDEX Apply 1 application topically daily as needed. Cancer spots   furosemide 20 MG tablet Commonly known as: LASIX Take 20 mg by mouth 2 (two) times daily.   omeprazole 40 MG capsule Commonly known as: PRILOSEC Take 40 mg by mouth daily.   ondansetron 8 MG  tablet Commonly known as: ZOFRAN Take 8 mg by mouth 3 (three) times daily as needed for nausea (before chemo treatment).   pimecrolimus 1 % cream Commonly known as: ELIDEL Apply 1 application topically daily.   rosuvastatin 20 MG tablet Commonly known as: CRESTOR Take 20 mg by mouth at bedtime.       Disposition and follow-up:   [name] was discharged from [facility] in Stable condition.  At the hospital follow up visit please address:  1.  A. Transient loss of consciousness: Resolved, no further follow up at this time. B.Hypotension: Resolved, holding home anti-hypertensive and advised to monitor BP daily and see PCP in a week.   2.  Labs / imaging needed at time of follow-up: None  3.  Pending labs/ test needing follow-up: Daily BP monitoring  Follow-up Appointments:  Follow-up Information    Pearson, Bethena Roys, MD Follow up.   Specialty: Internal Medicine Why: this is patients PCP Contact information: [facility] [address] [place] [address] [facility] Course by problem list: 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    Transient Loss of consciousness  Orthostatics taken and shows elevated HR but no hypotension. EKG and telemetry is reassuring. His BP has been at goal without Losartan or Metoprolol, so plan to hold on discharge. Patient has a BP cuff at home and is willing to take daily readings.  Cardiology consulted  due to hx of TAVR with now mild-mod AR. Repeat TTE showed findings consistent with his echo a few day ago; no new valvular dysfunction or ventricular failure. No evidence of valve dehiscence or stenosis.    Hypotension # Hx of Hypertension As noted above, will hold  home medications on discharge until patient develops hypertension prior to discharge, though unlikely. BP cuff at home and counseled to take daily readings.  Hold home losartan and metoprolol   Chronic Macrocytic Anemia  Suspect patient's baseline hemoglobin will continue to trend down with continued chemotherapy. No indication he is having an acute bleed. Folate, ferritin and B12 WNL. Continued to monitor while admitted. 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 Bone marrow +. Received cyclophosphamide, bortezomib and dexamethasone and currently on dara +CyBorD.  He remains on prophylactic acyclovir. Renal function stable while admitted.   Discharge Vitals:   BP (!) 122/59 (BP Location: Left Arm)   Pulse (!) 101   Temp 98.7 F (37.1 C) (Oral)   Resp 20   Ht 5\' 10"  (1.778 m)   Wt 63.5 kg Comment: scale b  SpO2 99%   BMI 20.09 kg/m   Pertinent Labs, Studies, and Procedures:  CBC Latest Ref Rng & Units 03/06/2020 03/05/2020 03/05/2020  WBC 4.0 - 10.5 K/uL 7.1 8.9 7.4  Hemoglobin 13.0 - 17.0 g/dL 8.4(L) 8.2(L) 7.6(L)  Hematocrit 39.0 - 52.0 % 24.9(L) 24.5(L) 21.8(L)  Platelets 150 - 400 K/uL 180 191 165   BMP Latest Ref Rng & Units 03/06/2020 03/05/2020 03/04/2020  Glucose 70 - 99 mg/dL 104(H) 101(H) 136(H)  BUN 8 - 23 mg/dL 15 23 30(H)  Creatinine 0.61 - 1.24 mg/dL 1.05 1.23 1.90(H)  Sodium 135 - 145 mmol/L 139 136 137  Potassium 3.5 - 5.1 mmol/L 3.9 3.8 3.9  Chloride 98 - 111 mmol/L 107 106 105  CO2 22 - 32 mmol/L 24 23 -  Calcium 8.9 - 10.3 mg/dL 8.4(L) 8.1(L) -   CT head IMPRESSION: 1. No acute intracranial abnormality. 2. Mild atrophy and white matter disease likely reflects the sequela of chronic microvascular ischemia. 3. Aspects 10/10  Chest x-ray IMPRESSION: 1. Prior median sternotomy and cardiac valve  replacement. Thoracic aortic prominence. Thoracic aortic aneurysm was noted by report on prior CT of 08/15/2011. Repeat contrast-enhanced chest CT should be considered for follow-up. 2. Mild bilateral interstitial prominence. This may be chronic. An active interstitial process cannot be completely excluded.  MRI head   IMPRESSION: 1. No acute intracranial abnormality. 2. Several chronic microhemorrhages scattered in the brain, most notably at the left thalamus. Mild for age additional signal changes likely due to chronic small vessel disease. 3. Suggestion of disproportionate midbrain volume loss.  MRA  IMPRESSION: Negative intracranial MRA. Discharge Instructions: Discharge Instructions    Call MD for:  difficulty breathing, headache or visual disturbances   Complete by: As directed    Call MD for:  extreme fatigue   Complete by: As directed    Call MD for:  hives   Complete by: As directed    Call MD for:  persistant dizziness or light-headedness  Complete by: As directed    Call MD for:  persistant nausea and vomiting   Complete by: As directed    Call MD for:  severe uncontrolled pain   Complete by: As directed    Call MD for:  temperature >100.4   Complete by: As directed    Discharge instructions   Complete by: As directed    Mr. Ryosuke, Ericksen were admitted to the hospital after an event that caused you to lose consciousness. Work up at the hospital included multiple scans of your head and neck area that ruled out stroke. An EEG was done to rule out seizure. Cardiology came by as well and check a heart ultrasound that showed no new changes to how your valve is working. Your kidney function, liver function and electrolytes were all stable.   Due to this, it is very possible your blood pressure may have dropped too low. We would recommend you stop your Losartan and Metoprolol. Take your blood pressure daily at home and keep a log. Follow up with your heart doctor or  primary care doctor in the next 1 week. If your blood pressure goes above 140/90 at home, call your doctor for additional recommendations.    It was a pleasure meeting you and your wife, and we wish you the best!   - Dr. Charleen Kirks (Dr. B)   Increase activity slowly   Complete by: As directed       Signed: Dr Meredith Staggers Chrissa Meetze Pager: [phone] After 5pm on weekdays and 1pm on weekends: On Call pager [phone]  03/16/2020, 10:43 AM

## 2021-12-24 DEATH — deceased
